# Patient Record
Sex: Female | Born: 1950 | Race: White | Hispanic: No | Marital: Married | Smoking: Never smoker
Health system: Southern US, Community
[De-identification: ages and names within clinical notes are randomized; demographics above are authoritative.]

## PROBLEM LIST (undated history)

## (undated) DIAGNOSIS — F329 Major depressive disorder, single episode, unspecified: Secondary | ICD-10-CM

## (undated) DIAGNOSIS — E079 Disorder of thyroid, unspecified: Secondary | ICD-10-CM

## (undated) DIAGNOSIS — N952 Postmenopausal atrophic vaginitis: Secondary | ICD-10-CM

## (undated) DIAGNOSIS — L80 Vitiligo: Secondary | ICD-10-CM

## (undated) DIAGNOSIS — G4730 Sleep apnea, unspecified: Secondary | ICD-10-CM

## (undated) DIAGNOSIS — I10 Essential (primary) hypertension: Secondary | ICD-10-CM

## (undated) DIAGNOSIS — F419 Anxiety disorder, unspecified: Secondary | ICD-10-CM

## (undated) DIAGNOSIS — G4700 Insomnia, unspecified: Secondary | ICD-10-CM

## (undated) DIAGNOSIS — F32A Depression, unspecified: Secondary | ICD-10-CM

## (undated) HISTORY — DX: Postmenopausal atrophic vaginitis: N95.2

## (undated) HISTORY — DX: Major depressive disorder, single episode, unspecified: F32.9

## (undated) HISTORY — DX: Sleep apnea, unspecified: G47.30

## (undated) HISTORY — DX: Depression, unspecified: F32.A

## (undated) HISTORY — DX: Insomnia, unspecified: G47.00

## (undated) HISTORY — PX: CHOLECYSTECTOMY: SHX55

## (undated) HISTORY — DX: Essential (primary) hypertension: I10

## (undated) HISTORY — DX: Vitiligo: L80

## (undated) HISTORY — DX: Disorder of thyroid, unspecified: E07.9

## (undated) HISTORY — DX: Anxiety disorder, unspecified: F41.9

---

## 1989-08-28 HISTORY — PX: CRYOTHERAPY: SHX1416

## 2007-08-26 ENCOUNTER — Ambulatory Visit: Payer: Self-pay | Admitting: Gastroenterology

## 2007-09-04 ENCOUNTER — Ambulatory Visit: Payer: Self-pay | Admitting: Gastroenterology

## 2007-09-04 ENCOUNTER — Encounter: Payer: Self-pay | Admitting: Gastroenterology

## 2007-09-04 DIAGNOSIS — K644 Residual hemorrhoidal skin tags: Secondary | ICD-10-CM | POA: Insufficient documentation

## 2007-09-04 DIAGNOSIS — K5730 Diverticulosis of large intestine without perforation or abscess without bleeding: Secondary | ICD-10-CM | POA: Insufficient documentation

## 2008-01-08 ENCOUNTER — Inpatient Hospital Stay: Payer: Self-pay | Admitting: Internal Medicine

## 2008-08-28 HISTORY — PX: COLONOSCOPY: SHX174

## 2008-10-19 ENCOUNTER — Emergency Department: Payer: Self-pay | Admitting: Emergency Medicine

## 2010-06-29 ENCOUNTER — Ambulatory Visit: Payer: Self-pay | Admitting: Internal Medicine

## 2010-10-21 ENCOUNTER — Ambulatory Visit: Payer: Self-pay | Admitting: Internal Medicine

## 2013-01-27 ENCOUNTER — Ambulatory Visit: Payer: Self-pay | Admitting: Neurology

## 2013-12-22 DIAGNOSIS — M5090 Cervical disc disorder, unspecified, unspecified cervical region: Secondary | ICD-10-CM | POA: Insufficient documentation

## 2013-12-22 DIAGNOSIS — Z9989 Dependence on other enabling machines and devices: Secondary | ICD-10-CM | POA: Insufficient documentation

## 2014-03-04 HISTORY — PX: ESOPHAGOGASTRODUODENOSCOPY ENDOSCOPY: SHX5814

## 2014-03-05 ENCOUNTER — Ambulatory Visit: Payer: Self-pay | Admitting: Gastroenterology

## 2014-03-09 LAB — PATHOLOGY REPORT

## 2014-04-21 ENCOUNTER — Encounter: Payer: Self-pay | Admitting: Gastroenterology

## 2015-06-23 ENCOUNTER — Other Ambulatory Visit: Payer: Self-pay | Admitting: Internal Medicine

## 2015-06-23 DIAGNOSIS — M5000 Cervical disc disorder with myelopathy, unspecified cervical region: Secondary | ICD-10-CM

## 2015-07-02 ENCOUNTER — Ambulatory Visit
Admission: RE | Admit: 2015-07-02 | Discharge: 2015-07-02 | Disposition: A | Discharge status: Home / Self Care | Payer: BLUE CROSS/BLUE SHIELD | Source: Ambulatory Visit | Attending: Internal Medicine | Admitting: Internal Medicine

## 2015-07-02 DIAGNOSIS — M5010 Cervical disc disorder with radiculopathy, unspecified cervical region: Secondary | ICD-10-CM | POA: Diagnosis present

## 2015-07-02 DIAGNOSIS — M2578 Osteophyte, vertebrae: Secondary | ICD-10-CM | POA: Insufficient documentation

## 2015-07-02 DIAGNOSIS — M50221 Other cervical disc displacement at C4-C5 level: Secondary | ICD-10-CM | POA: Diagnosis not present

## 2015-07-02 DIAGNOSIS — M4802 Spinal stenosis, cervical region: Secondary | ICD-10-CM | POA: Diagnosis not present

## 2015-07-02 DIAGNOSIS — M5000 Cervical disc disorder with myelopathy, unspecified cervical region: Secondary | ICD-10-CM

## 2015-07-27 DIAGNOSIS — D510 Vitamin B12 deficiency anemia due to intrinsic factor deficiency: Secondary | ICD-10-CM | POA: Insufficient documentation

## 2015-09-29 HISTORY — PX: NECK SURGERY: SHX720

## 2016-03-01 DIAGNOSIS — E039 Hypothyroidism, unspecified: Secondary | ICD-10-CM | POA: Insufficient documentation

## 2016-06-05 ENCOUNTER — Other Ambulatory Visit: Payer: Self-pay | Admitting: Obstetrics and Gynecology

## 2016-06-05 ENCOUNTER — Inpatient Hospital Stay
Admission: RE | Admit: 2016-06-05 | Discharge: 2016-06-05 | Disposition: A | Discharge status: Home / Self Care | Payer: Self-pay | Source: Ambulatory Visit | Attending: *Deleted | Admitting: *Deleted

## 2016-06-05 ENCOUNTER — Other Ambulatory Visit: Payer: Self-pay | Admitting: *Deleted

## 2016-06-05 DIAGNOSIS — Z9289 Personal history of other medical treatment: Secondary | ICD-10-CM

## 2016-06-05 DIAGNOSIS — N63 Unspecified lump in unspecified breast: Secondary | ICD-10-CM

## 2016-06-07 ENCOUNTER — Ambulatory Visit
Admission: RE | Admit: 2016-06-07 | Discharge: 2016-06-07 | Disposition: A | Discharge status: Home / Self Care | Payer: BLUE CROSS/BLUE SHIELD | Source: Ambulatory Visit | Attending: Obstetrics and Gynecology | Admitting: Obstetrics and Gynecology

## 2016-06-07 DIAGNOSIS — N63 Unspecified lump in unspecified breast: Secondary | ICD-10-CM

## 2016-06-16 ENCOUNTER — Other Ambulatory Visit: Payer: BLUE CROSS/BLUE SHIELD

## 2016-06-16 ENCOUNTER — Ambulatory Visit: Payer: BLUE CROSS/BLUE SHIELD

## 2017-07-05 ENCOUNTER — Other Ambulatory Visit: Payer: Self-pay | Admitting: Internal Medicine

## 2017-07-05 DIAGNOSIS — Z1231 Encounter for screening mammogram for malignant neoplasm of breast: Secondary | ICD-10-CM

## 2017-07-23 ENCOUNTER — Ambulatory Visit (INDEPENDENT_AMBULATORY_CARE_PROVIDER_SITE_OTHER): Payer: BLUE CROSS/BLUE SHIELD | Admitting: Obstetrics and Gynecology

## 2017-07-23 ENCOUNTER — Encounter: Payer: Self-pay | Admitting: Obstetrics and Gynecology

## 2017-07-23 VITALS — BP 112/72 | HR 60 | Ht 66.0 in | Wt 163.0 lb

## 2017-07-23 DIAGNOSIS — Z780 Asymptomatic menopausal state: Secondary | ICD-10-CM

## 2017-07-23 DIAGNOSIS — Z13820 Encounter for screening for osteoporosis: Secondary | ICD-10-CM | POA: Diagnosis not present

## 2017-07-23 DIAGNOSIS — Z23 Encounter for immunization: Secondary | ICD-10-CM

## 2017-07-23 DIAGNOSIS — N941 Unspecified dyspareunia: Secondary | ICD-10-CM

## 2017-07-23 DIAGNOSIS — Z9189 Other specified personal risk factors, not elsewhere classified: Secondary | ICD-10-CM | POA: Diagnosis not present

## 2017-07-23 DIAGNOSIS — L80 Vitiligo: Secondary | ICD-10-CM

## 2017-07-23 DIAGNOSIS — Z0000 Encounter for general adult medical examination without abnormal findings: Secondary | ICD-10-CM | POA: Diagnosis not present

## 2017-07-23 MED ORDER — PRASTERONE 6.5 MG VA INST: 6.5000 mg | VAGINAL_INSERT | Freq: Every day | VAGINAL | 11 refills | Status: AC

## 2017-07-23 NOTE — Progress Notes (Signed)
Gynecology Annual Exam  PCP: Rusty Aus, MD  Chief Complaint:  Chief Complaint  Patient presents with  . Gynecologic Exam    History of Present Illness: Patient is a 66 y.o. G0P0000 presents for annual exam. The patient complains of dyspareunia and vaginal dryness.   LMP: No LMP recorded. Patient is postmenopausal. Denies postmenopausal bleeding or spotting.  The patient is sexually active. She currently uses none for contraception. She admits to dyspareunia.  The patient does perform self breast exams.  There is no notable family history of breast or ovarian cancer in her family.  The patient wears seatbelts: yes.   The patient has regular exercise: not asked.    The patient denies current symptoms of depression.    Review of Systems: Review of Systems  Constitutional: Negative for chills, fever, malaise/fatigue and weight loss.  HENT: Negative for congestion, hearing loss and sinus pain.   Eyes: Negative for blurred vision and double vision.  Respiratory: Negative for cough, sputum production, shortness of breath and wheezing.   Cardiovascular: Negative for chest pain, palpitations, orthopnea and leg swelling.  Gastrointestinal: Negative for abdominal pain, constipation, diarrhea, nausea and vomiting.  Genitourinary: Negative for dysuria, flank pain, frequency, hematuria and urgency.  Musculoskeletal: Negative for back pain, falls and joint pain.  Skin: Negative for itching and rash.  Neurological: Negative for dizziness and headaches.  Psychiatric/Behavioral: Negative for depression, substance abuse and suicidal ideas. The patient is not nervous/anxious.     Past Medical History:  Past Medical History:  Diagnosis Date  . Anxiety   . Atrophic vaginitis   . Depression   . Hypertension   . Insomnia   . Sleep apnea   . Thyroid disease   . Vitiligo     Past Surgical History:  Past Surgical History:  Procedure Laterality Date  . CHOLECYSTECTOMY    .  COLONOSCOPY  2010   normal  . CRYOTHERAPY  1991  . ESOPHAGOGASTRODUODENOSCOPY ENDOSCOPY  03/04/2014  . NECK SURGERY  09/2015   degenerative disc     Gynecologic History:  No LMP recorded. Patient is postmenopausal. Contraception: none Last Pap: Results were: NIL and HR HPV negative . Reviewed in [facility], paps as far back as 2011 were all negative.  Last mammogram:06/07/2016 Results were: BI-RAD II Obstetric History: G0P0000  Family History:  Family History  Problem Relation Age of Onset  . Breast cancer Sister 60  . Stomach cancer Mother 34    Social History:  Social History   Socioeconomic History  . Marital status: Married    Spouse name: Not on file  . Number of children: Not on file  . Years of education: Not on file  . Highest education level: Not on file  Social Needs  . Financial resource strain: Not on file  . Food insecurity - worry: Not on file  . Food insecurity - inability: Not on file  . Transportation needs - medical: Not on file  . Transportation needs - non-medical: Not on file  Occupational History  . Not on file  Tobacco Use  . Smoking status: Never Smoker  . Smokeless tobacco: Never Used  Substance and Sexual Activity  . Alcohol use: No    Frequency: Never  . Drug use: No  . Sexual activity: Yes    Birth control/protection: Post-menopausal  Other Topics Concern  . Not on file  Social History Narrative  . Not on file    Allergies:  Allergies  Allergen Reactions  .  Venlafaxine Shortness Of Breath    Medications: Prior to Admission medications   Medication Sig Start Date End Date Taking? Authorizing Provider  ALPRAZolam Duanne Moron) 0.5 MG tablet Take by mouth. 11/18/14  Yes [provider]  citalopram (CELEXA) 20 MG tablet Take by mouth. 03/29/16  Yes [provider]  Cyanocobalamin (B-12) 1000 MCG LOZG Place under the tongue.   Yes [provider]  hydrochlorothiazide (HYDRODIURIL) 25 MG tablet Take 25 mg by mouth  daily. for blood pressure 05/29/17  Yes [provider]  mupirocin cream (BACTROBAN) 2 % Apply topically 2 (two) times daily as needed. 05/25/17  Yes [provider]  SYNTHROID 150 MCG tablet TAKE 1 TAB BY MOUTH ONCE DAILY ON AN EMPTY STOMACH WITH GLASS OF WATER 30-60 MINS BEFORE BREAKFAST 05/25/17  Yes [provider]  zolpidem (AMBIEN) 10 MG tablet TAKE 1 TABLET BY MOUTH EVERY DAY AT NIGHT 07/15/17  Yes [provider]  Prasterone (INTRAROSA) 6.5 MG INST Place 6.5 mg vaginally at bedtime. 07/23/17 08/22/17  Homero Fellers, MD    Physical Exam Vitals: Blood pressure 112/72, pulse 60, height 5\' 6"  (1.676 m), weight 163 lb (73.9 kg).  Physical Exam  Constitutional: She is oriented to person, place, and time. She appears well-developed.  Genitourinary: Vagina normal and uterus normal. Pelvic exam was performed with patient prone. There is no lesion on the right labia. There is no lesion on the left labia.    Vagina exhibits no lesion. Right adnexum does not display mass. Left adnexum does not display mass. Cervix does not exhibit motion tenderness, lesion, discharge or polyp.    Genitourinary Comments: Rectal deferred at patient request.  HENT:  Head: Normocephalic and atraumatic.  Eyes: EOM are normal.  Neck: Neck supple. No thyromegaly present.  Cardiovascular: Normal rate, regular rhythm and normal heart sounds.  Pulmonary/Chest: Effort normal and breath sounds normal. Right breast exhibits no inverted nipple, no mass, no nipple discharge and no skin change. Left breast exhibits no inverted nipple, no mass, no nipple discharge and no skin change.  Abdominal: Soft. Bowel sounds are normal. She exhibits no distension and no mass.  Neurological: She is alert and oriented to person, place, and time.  Skin: Skin is warm and dry.  Psychiatric: She has a normal mood and affect. Her behavior is normal. Judgment and thought content normal.  Vitals  reviewed.    Female chaperone present for pelvic and breast  portions of the physical exam  Assessment: 66 y.o. G0P0000 routine annual exam  Plan: Problem List Items Addressed This Visit    None    Visit Diagnoses    Need for prophylactic vaccination and inoculation against influenza    -  Primary   Relevant Orders   Flu Vaccine QUAD 36+ mos IM (Fluarix, Quad PF) (Completed)   Health care maintenance       Relevant Orders   DG Bone Density   PapIG, HPV, rfx 16/18   At risk for decreased bone density       Relevant Orders   DG Bone Density   Dyspareunia in female       Relevant Medications   Prasterone (INTRAROSA) 6.5 MG INST      1) Mammogram - recommend yearly screening mammogram.  Mammogram is planned for 07/25/2017  2) STI screening was offered and declined  3) ASCCP guidelines and rational discussed.  Patient opts for continued screening interval  4) Dexa scan ordered.  5) Colonoscopy -- Screening recommended starting  at age 9 for average risk individuals, age 3 for individuals deemed at increased risk (including African Americans) and recommended to continue until age 50.  For patient age 5-85 individualized approach is recommended.  Gold standard screening is via colonoscopy, Cologuard screening is an acceptable alternative for patient unwilling or unable to undergo colonoscopy.  "Colorectal cancer screening for average?risk adults: 2018 guideline update from the American Cancer Society"[place]: A Cancer Journal for Clinicians: Jan 24, 2017  Patient reports that her last colonoscopy was 8 years ago and that she will need on in 2 years.    6) Routine healthcare maintenance including cholesterol, diabetes screening discussed managed by PCP   7) Dyspareunia- patient was given sample of intrarosa. Discussed MonaLisa Touch procedure.   Follow up in 1 year.  Patient was okay with phone calls and leaving messages about test results.

## 2017-07-25 ENCOUNTER — Ambulatory Visit
Admission: RE | Admit: 2017-07-25 | Discharge: 2017-07-25 | Disposition: A | Discharge status: Home / Self Care | Payer: BLUE CROSS/BLUE SHIELD | Source: Ambulatory Visit | Attending: Internal Medicine | Admitting: Internal Medicine

## 2017-07-25 DIAGNOSIS — Z1231 Encounter for screening mammogram for malignant neoplasm of breast: Secondary | ICD-10-CM | POA: Diagnosis not present

## 2017-07-26 LAB — PAPIG, HPV, RFX 16/18: PAP SMEAR COMMENT: 0

## 2017-07-26 LAB — HPV, LOW VOLUME (REFLEX): HPV, LOW VOL REFLEX: NEGATIVE

## 2017-07-31 NOTE — Addendum Note (Signed)
Addended by: Adrian Prows on: 07/31/2017 02:17 PM   Modules accepted: Orders

## 2017-07-31 NOTE — Progress Notes (Signed)
Discussed Result with patient. Repeat in 5 years. If hx of 10 years of normals can discontinue screening because of age >66yo.

## 2017-09-12 ENCOUNTER — Ambulatory Visit
Admission: RE | Admit: 2017-09-12 | Discharge: 2017-09-12 | Disposition: A | Discharge status: Home / Self Care | Payer: BLUE CROSS/BLUE SHIELD | Source: Ambulatory Visit | Attending: Obstetrics and Gynecology | Admitting: Obstetrics and Gynecology

## 2017-09-12 DIAGNOSIS — Z8262 Family history of osteoporosis: Secondary | ICD-10-CM | POA: Insufficient documentation

## 2017-09-12 DIAGNOSIS — M85852 Other specified disorders of bone density and structure, left thigh: Secondary | ICD-10-CM | POA: Insufficient documentation

## 2017-09-12 DIAGNOSIS — Z780 Asymptomatic menopausal state: Secondary | ICD-10-CM | POA: Diagnosis present

## 2017-09-26 ENCOUNTER — Telehealth: Payer: Self-pay

## 2017-09-26 NOTE — Telephone Encounter (Signed)
Please advise for results. Thank you!

## 2017-09-26 NOTE — Telephone Encounter (Signed)
Pt had Dexa Scan on 09/12/17 ordered by CS. She hasn't received results. She is unable to get into my chart. Cb#[phone]

## 2017-10-01 NOTE — Telephone Encounter (Signed)
Also tried to call, no answer

## 2017-10-01 NOTE — Telephone Encounter (Signed)
Osteopenia:  vitamin D 600 Iu/day , calciuma 1200 mg/day, weight bearing exercise, balance training. Minimize alcohol consumption.  Called, no answer left message to call the office tomorrow to discuss. Thank you

## 2017-10-01 NOTE — Progress Notes (Signed)
Called ask patient to call the office tomorrow so that we could discuss result. Osteopenia, recommend repeat screening in 5 years, vitamin D 6000 IU/day and calcium supplementation 1200 mg/day, healthy lifestyle, weight bearing resistance exercise, and balance training.

## 2017-10-02 ENCOUNTER — Encounter: Payer: Self-pay | Admitting: Gastroenterology

## 2017-10-03 NOTE — Telephone Encounter (Signed)
Pt is calling about her bone density results. Pt report had missed call last week. Please advise

## 2017-10-04 NOTE — Telephone Encounter (Signed)
Pt states she has missed a call regarding her bone density results. She would like to speak to someone, she has a Advertising account executive and a message may be left if need be. CB# [phone]  PT aware of CS message. She will call back if she has any questions

## 2018-01-16 ENCOUNTER — Encounter: Payer: Self-pay | Admitting: Obstetrics and Gynecology

## 2018-01-16 ENCOUNTER — Ambulatory Visit (INDEPENDENT_AMBULATORY_CARE_PROVIDER_SITE_OTHER): Payer: BLUE CROSS/BLUE SHIELD | Admitting: Obstetrics and Gynecology

## 2018-01-16 VITALS — BP 130/80 | HR 63 | Ht 66.0 in | Wt 164.5 lb

## 2018-01-16 DIAGNOSIS — N3001 Acute cystitis with hematuria: Secondary | ICD-10-CM | POA: Diagnosis not present

## 2018-01-16 LAB — POCT URINALYSIS DIPSTICK
BILIRUBIN UA: NEGATIVE
GLUCOSE UA: NEGATIVE
Ketones, UA: NEGATIVE
Nitrite, UA: NEGATIVE
Protein, UA: POSITIVE — AB
Spec Grav, UA: 1.01 (ref 1.010–1.025)
pH, UA: 8 (ref 5.0–8.0)

## 2018-01-16 MED ORDER — NITROFURANTOIN MONOHYD MACRO 100 MG PO CAPS
100.0000 mg | ORAL_CAPSULE | Freq: Two times a day (BID) | ORAL | 0 refills | Status: DC
Start: 2018-01-16 — End: 2019-12-29

## 2018-01-16 NOTE — Progress Notes (Signed)
Rusty Aus, MD   Chief Complaint  Patient presents with  . Urinary Tract Infection    Uncomfortable, frequency x1 day     HPI:      [name] is a 67 y.o. G0P0000 who LMP was No LMP recorded. Patient is postmenopausal., presents today for UTI sx of urinary frequency, urgency, dysuria, and pressure since yesterday. No LBP, fevers, hematuria. No vag sx, no VB. Hx of UTIs yrs ago but not recently.    Past Medical History:  Diagnosis Date  . Anxiety   . Atrophic vaginitis   . Depression   . Hypertension   . Insomnia   . Sleep apnea   . Thyroid disease   . Vitiligo     Past Surgical History:  Procedure Laterality Date  . CHOLECYSTECTOMY    . COLONOSCOPY  2010   normal  . CRYOTHERAPY  1991  . ESOPHAGOGASTRODUODENOSCOPY ENDOSCOPY  03/04/2014  . NECK SURGERY  09/2015   degenerative disc     Family History  Problem Relation Age of Onset  . Breast cancer Sister 7  . Stomach cancer Mother 45    Social History   Socioeconomic History  . Marital status: Married    Spouse name: Not on file  . Number of children: Not on file  . Years of education: Not on file  . Highest education level: Not on file  Occupational History  . Not on file  Social Needs  . Financial resource strain: Not on file  . Food insecurity:    Worry: Not on file    Inability: Not on file  . Transportation needs:    Medical: Not on file    Non-medical: Not on file  Tobacco Use  . Smoking status: Never Smoker  . Smokeless tobacco: Never Used  Substance and Sexual Activity  . Alcohol use: No    Frequency: Never  . Drug use: No  . Sexual activity: Yes    Birth control/protection: Post-menopausal  Lifestyle  . Physical activity:    Days per week: 0 days    Minutes per session: 0 min  . Stress: Very much  Relationships  . Social connections:    Talks on phone: Three times a week    Gets together: Once a week    Attends religious service: Never    Active member of  club or organization: No    Attends meetings of clubs or organizations: Never    Relationship status: Married  . Intimate partner violence:    Fear of current or ex partner: No    Emotionally abused: No    Physically abused: No    Forced sexual activity: No  Other Topics Concern  . Not on file  Social History Narrative  . Not on file    Outpatient Medications Prior to Visit  Medication Sig Dispense Refill  . hydrochlorothiazide (HYDRODIURIL) 25 MG tablet Take 25 mg by mouth daily. for blood pressure  3  . SYNTHROID 150 MCG tablet TAKE 1 TAB BY MOUTH ONCE DAILY ON AN EMPTY STOMACH WITH GLASS OF WATER 30-60 MINS BEFORE BREAKFAST  3  . zolpidem (AMBIEN) 10 MG tablet TAKE 1 TABLET BY MOUTH EVERY DAY AT NIGHT  5  . ALPRAZolam (XANAX) 0.5 MG tablet Take by mouth.    . citalopram (CELEXA) 20 MG tablet Take by mouth.    . Cyanocobalamin (B-12) 1000 MCG LOZG Place under the tongue.    . mupirocin cream (BACTROBAN) 2 %  Apply topically 2 (two) times daily as needed.  12   No facility-administered medications prior to visit.     ROS:  Review of Systems  Constitutional: Negative for fever.  Gastrointestinal: Negative for blood in stool, constipation, diarrhea, nausea and vomiting.  Genitourinary: Positive for dysuria, frequency and urgency. Negative for dyspareunia, flank pain, hematuria, vaginal bleeding, vaginal discharge and vaginal pain.  Musculoskeletal: Negative for back pain.  Skin: Negative for rash.   BREAST: No symptoms   OBJECTIVE:   Vitals:  BP 130/80   Pulse 63   Ht 5\' 6"  (1.676 m)   Wt 164 lb 8 oz (74.6 kg)   BMI 26.55 kg/m   Physical Exam  Constitutional: She is oriented to person, place, and time. She appears well-developed.  Pulmonary/Chest: Effort normal.  Abdominal: There is no CVA tenderness.  Musculoskeletal: Normal range of motion.  Neurological: She is alert and oriented to person, place, and time. No cranial nerve deficit.  Psychiatric: She has a  normal mood and affect. Her behavior is normal. Judgment and thought content normal.  Vitals reviewed.   Results: Results for orders placed or performed in visit on 01/16/18 (from the past 24 hour(s))  POCT Urinalysis Dipstick     Status: Abnormal   Collection Time: 01/16/18 10:13 AM  Result Value Ref Range   Color, UA yellow    Clarity, UA cloudy    Glucose, UA Negative Negative   Bilirubin, UA neg    Ketones, UA neg    Spec Grav, UA 1.010 1.010 - 1.025   Blood, UA mod    pH, UA 8.0 5.0 - 8.0   Protein, UA Positive (A) Negative   Urobilinogen, UA  0.2 or 1.0 E.U./dL   Nitrite, UA neg    Leukocytes, UA Moderate (2+) (A) Negative   Appearance     Odor       Assessment/Plan: Acute cystitis with hematuria - Pos dip. Check C&S. Rx macrobid. F/u prn.  - Plan: POCT Urinalysis Dipstick, Urine Culture, nitrofurantoin, macrocrystal-monohydrate, (MACROBID) 100 MG capsule    Meds ordered this encounter  Medications  . nitrofurantoin, macrocrystal-monohydrate, (MACROBID) 100 MG capsule    Sig: Take 1 capsule (100 mg total) by mouth 2 (two) times daily.    Dispense:  10 capsule    Refill:  0    Order Specific Question:   Supervising Provider    Answer:   Gae Dry [[id number]]      Return if symptoms worsen or fail to improve.  Alicia B. Copland, PA-C 01/16/2018 10:15 AM

## 2018-01-16 NOTE — Patient Instructions (Signed)
I value your feedback and entrusting us with your care. If you get a [facility] patient survey, I would appreciate you taking the time to let us know about your experience today. Thank you! 

## 2018-01-18 LAB — URINE CULTURE

## 2018-05-06 DIAGNOSIS — M8589 Other specified disorders of bone density and structure, multiple sites: Secondary | ICD-10-CM | POA: Insufficient documentation

## 2018-08-23 ENCOUNTER — Other Ambulatory Visit: Payer: Self-pay | Admitting: Internal Medicine

## 2018-08-23 DIAGNOSIS — Z1231 Encounter for screening mammogram for malignant neoplasm of breast: Secondary | ICD-10-CM

## 2018-09-25 ENCOUNTER — Ambulatory Visit: Payer: BLUE CROSS/BLUE SHIELD | Admitting: Obstetrics and Gynecology

## 2018-10-01 ENCOUNTER — Ambulatory Visit
Admission: RE | Admit: 2018-10-01 | Discharge: 2018-10-01 | Disposition: A | Discharge status: Home / Self Care | Payer: BLUE CROSS/BLUE SHIELD | Source: Ambulatory Visit | Attending: Internal Medicine | Admitting: Internal Medicine

## 2018-10-01 DIAGNOSIS — Z1231 Encounter for screening mammogram for malignant neoplasm of breast: Secondary | ICD-10-CM | POA: Insufficient documentation

## 2018-10-03 ENCOUNTER — Other Ambulatory Visit (HOSPITAL_COMMUNITY)
Admission: RE | Admit: 2018-10-03 | Discharge: 2018-10-03 | Disposition: A | Discharge status: Home / Self Care | Payer: BLUE CROSS/BLUE SHIELD | Source: Ambulatory Visit | Attending: Obstetrics and Gynecology | Admitting: Obstetrics and Gynecology

## 2018-10-03 ENCOUNTER — Ambulatory Visit (INDEPENDENT_AMBULATORY_CARE_PROVIDER_SITE_OTHER): Payer: BLUE CROSS/BLUE SHIELD | Admitting: Obstetrics and Gynecology

## 2018-10-03 ENCOUNTER — Encounter: Payer: Self-pay | Admitting: Obstetrics and Gynecology

## 2018-10-03 VITALS — BP 110/62 | HR 72 | Ht 66.0 in | Wt 157.0 lb

## 2018-10-03 DIAGNOSIS — Z01419 Encounter for gynecological examination (general) (routine) without abnormal findings: Secondary | ICD-10-CM | POA: Diagnosis not present

## 2018-10-03 DIAGNOSIS — D280 Benign neoplasm of vulva: Secondary | ICD-10-CM | POA: Diagnosis not present

## 2018-10-03 DIAGNOSIS — Z0000 Encounter for general adult medical examination without abnormal findings: Secondary | ICD-10-CM

## 2018-10-03 DIAGNOSIS — L80 Vitiligo: Secondary | ICD-10-CM

## 2018-10-03 DIAGNOSIS — R1900 Intra-abdominal and pelvic swelling, mass and lump, unspecified site: Secondary | ICD-10-CM

## 2018-10-03 DIAGNOSIS — N9089 Other specified noninflammatory disorders of vulva and perineum: Secondary | ICD-10-CM

## 2018-10-03 NOTE — Progress Notes (Signed)
Gynecology Annual Exam  PCP: Rusty Aus, MD  Chief Complaint:  Chief Complaint  Patient presents with  . Gynecologic Exam    History of Present Illness:Patient is a 68 y.o. G0P0000 presents for annual exam. The patient has no complaints today.   LMP: No LMP recorded. Patient is postmenopausal.  The patient is sexually active. She denies dyspareunia.  The patient does perform self breast exams.  There is no notable family history of breast or ovarian cancer in her family.  The patient wears seatbelts: yes.   The patient has regular exercise: not asked.    The patient denies current symptoms of depression.     Review of Systems: ROS  Past Medical History:  Past Medical History:  Diagnosis Date  . Anxiety   . Atrophic vaginitis   . Depression   . Hypertension   . Insomnia   . Sleep apnea   . Thyroid disease   . Vitiligo     Past Surgical History:  Past Surgical History:  Procedure Laterality Date  . CHOLECYSTECTOMY    . COLONOSCOPY  2010   normal  . CRYOTHERAPY  1991  . ESOPHAGOGASTRODUODENOSCOPY ENDOSCOPY  03/04/2014  . NECK SURGERY  09/2015   degenerative disc     Gynecologic History:  No LMP recorded. Patient is postmenopausal. Last Pap: Results were: 2019- NIL HPV-   Last mammogram: 2020 Results were: BI-RAD I  Obstetric History: G0P0000  Family History:  Family History  Problem Relation Age of Onset  . Breast cancer Sister 79  . Stomach cancer Mother 78    Social History:  Social History   Socioeconomic History  . Marital status: Married    Spouse name: Not on file  . Number of children: Not on file  . Years of education: Not on file  . Highest education level: Not on file  Occupational History  . Not on file  Social Needs  . Financial resource strain: Not on file  . Food insecurity:    Worry: Not on file    Inability: Not on file  . Transportation needs:    Medical: Not on file    Non-medical: Not on file  Tobacco Use  .  Smoking status: Never Smoker  . Smokeless tobacco: Never Used  Substance and Sexual Activity  . Alcohol use: No    Frequency: Never  . Drug use: No  . Sexual activity: Yes    Birth control/protection: Post-menopausal  Lifestyle  . Physical activity:    Days per week: 0 days    Minutes per session: 0 min  . Stress: Very much  Relationships  . Social connections:    Talks on phone: Three times a week    Gets together: Once a week    Attends religious service: Never    Active member of club or organization: No    Attends meetings of clubs or organizations: Never    Relationship status: Married  . Intimate partner violence:    Fear of current or ex partner: No    Emotionally abused: No    Physically abused: No    Forced sexual activity: No  Other Topics Concern  . Not on file  Social History Narrative  . Not on file    Allergies:  Allergies  Allergen Reactions  . Venlafaxine Shortness Of Breath    Medications: Prior to Admission medications   Medication Sig Start Date End Date Taking? Authorizing Provider  ALPRAZolam Duanne Moron) 0.5 MG tablet  Take by mouth. 11/18/14  Yes [provider]  citalopram (CELEXA) 20 MG tablet Take by mouth. 03/29/16  Yes [provider]  Cyanocobalamin (B-12) 1000 MCG LOZG Place under the tongue.   Yes [provider]  gabapentin (NEURONTIN) 100 MG capsule Take by mouth. 05/06/18 05/06/19 Yes [provider]  hydrochlorothiazide (HYDRODIURIL) 25 MG tablet Take 25 mg by mouth daily. for blood pressure 05/29/17  Yes [provider]  nitrofurantoin, macrocrystal-monohydrate, (MACROBID) 100 MG capsule Take 1 capsule (100 mg total) by mouth 2 (two) times daily. 4/40/34  Yes Copland, Elmo Putt B, PA-C  pantoprazole (PROTONIX) 40 MG tablet TAKE 1 TABLET BY MOUTH EVERY DAY 09/20/18  Yes [provider]  SYNTHROID 150 MCG tablet TAKE 1 TAB BY MOUTH ONCE DAILY ON AN EMPTY STOMACH WITH GLASS OF WATER 30-60 MINS BEFORE  BREAKFAST 05/25/17  Yes [provider]  zolpidem (AMBIEN) 10 MG tablet TAKE 1 TABLET BY MOUTH EVERY DAY AT NIGHT 07/15/17  Yes [provider]    Physical Exam Vitals: Blood pressure 110/62, pulse 72, height 5\' 6"  (1.676 m), weight 157 lb (71.2 kg).  General: NAD HEENT: normocephalic, anicteric Thyroid: no enlargement, no palpable nodules Pulmonary: No increased work of breathing, CTAB Cardiovascular: RRR, distal pulses 2+ Breast: Breast symmetrical, no tenderness, no palpable nodules or masses, no skin or nipple retraction present, no nipple discharge.  No axillary or supraclavicular lymphadenopathy. Abdomen: NABS, soft, non-tender, non-distended.  Umbilicus without lesions.  No hepatomegaly, splenomegaly or masses palpable. No evidence of hernia  Genitourinary:  External: Normal external female genitalia.  Normal urethral meatus, normal Bartholin's and Skene's glands.    Vagina: Normal vaginal mucosa, no evidence of prolapse.    Cervix: Grossly normal in appearance, no bleeding  Uterus: Non-enlarged, mobile, normal contour.  No CMT  Adnexa: ovaries non-enlarged, no adnexal masses  Rectal: deferred  Lymphatic: no evidence of inguinal lymphadenopathy Extremities: no edema, erythema, or tenderness Neurologic: Grossly intact Psychiatric: mood appropriate, affect full  Female chaperone present for pelvic and breast  portions of the physical exam   VULVAR BIOPSY NOTE The indications for vulvar biopsy (rule out neoplasia, establish lichen sclerosus diagnosis) were reviewed.   Risks of the biopsy including pain, bleeding, infection, inadequate specimen, scarring and need for additional procedures  were discussed. The patient stated understanding and agreed to undergo procedure today. Consent was signed,  time out performed.   The patient's vulva was prepped with Betadine. 1% lidocaine was injected into area of concern. A 3 -mm punch biopsy was done, biopsy tissue was  picked up with sterile forceps and sterile scissors were used to excise the lesion.  Small bleeding was noted and hemostasis was achieved using silver nitrate sticks.  The patient tolerated the procedure well. Post-procedure instructions  (pelvic rest for one week) were given to the patient. The patient is to call with heavy bleeding, fever greater than 100.4, foul smelling vaginal discharge or other concerns.      Assessment: 68 y.o. G0P0000 routine annual exam  Plan: Problem List Items Addressed This Visit    None    Visit Diagnoses    Health care maintenance    -  Primary   Pelvic mass       Relevant Orders   US PELVIS TRANSVANGINAL NON-OB (TV ONLY)   Vulvar lesion       Primary vitiligo          1) Mammogram - recommend yearly screening mammogram.  Mammogram Is up to  date  2) STI screening  was notoffered and therefore not obtained  3) ASCCP guidelines and rational discussed.  Patient opts for every 3 years screening interval  4) Osteopenia- patient is taking calcium and vitamin D supplementation, repeat DEXA scan in 5 years.   5) Routine healthcare maintenance including cholesterol, diabetes screening discussed managed by PCP  6) Colonoscopy is planned for the end of this month. Patient has scheduled this procedure with GI   7) Vulvar biopsy performed today  8) Left ovary feels enlarged, will obtain pelvic US.  9)  Return in about 1 week (around 10/10/2018) for Return GYN and [place].  Colonoscopy planned for the end of the month Vulvar biopsy performed today Will follow up for pelvic US in 1 week Discussed osteopenia- vitamin D and calcium supplementation  Adrian Prows MD [place], [place] 10/03/2018 10:02 AM

## 2018-10-03 NOTE — Patient Instructions (Signed)
[facility] [address] [address] [phone]  "Scream Cream"

## 2018-10-07 NOTE — Progress Notes (Signed)
Called and discussed benign result with patient

## 2018-10-17 ENCOUNTER — Ambulatory Visit: Payer: Medicare Other | Admitting: Obstetrics and Gynecology

## 2018-10-17 ENCOUNTER — Ambulatory Visit (INDEPENDENT_AMBULATORY_CARE_PROVIDER_SITE_OTHER): Payer: BLUE CROSS/BLUE SHIELD | Admitting: Obstetrics and Gynecology

## 2018-10-17 ENCOUNTER — Encounter: Payer: Self-pay | Admitting: Obstetrics and Gynecology

## 2018-10-17 ENCOUNTER — Ambulatory Visit (INDEPENDENT_AMBULATORY_CARE_PROVIDER_SITE_OTHER): Payer: BLUE CROSS/BLUE SHIELD

## 2018-10-17 VITALS — BP 134/80 | HR 71 | Ht 66.0 in | Wt 156.0 lb

## 2018-10-17 DIAGNOSIS — R1900 Intra-abdominal and pelvic swelling, mass and lump, unspecified site: Secondary | ICD-10-CM

## 2018-10-17 DIAGNOSIS — N9089 Other specified noninflammatory disorders of vulva and perineum: Secondary | ICD-10-CM | POA: Diagnosis not present

## 2018-10-17 NOTE — Progress Notes (Signed)
Patient ID: Jill Hart, female   DOB: [birth year], 68 y.o.   MRN: [id number]  Reason for Consult: Follow-up (U/S follow up )   Referred by Rusty Aus, MD  Subjective:     HPI:  Jill Hart is a 68 y.o. female She is following up today for an [place] after pelvic exam suggested pelvic mass. She is feeling well and reports no complaints. She has a colonoscopy and upper endoscopy planned for next week with GI. She underwent a vulvar biopsy at her most recent visit which resulted as [place].  Past Medical History:  Diagnosis Date  . Anxiety   . Atrophic vaginitis   . Depression   . Hypertension   . Insomnia   . Sleep apnea   . Thyroid disease   . Vitiligo    Family History  Problem Relation Age of Onset  . Breast cancer Sister 43  . Stomach cancer Mother 75   Past Surgical History:  Procedure Laterality Date  . CHOLECYSTECTOMY    . COLONOSCOPY  2010   normal  . CRYOTHERAPY  1991  . ESOPHAGOGASTRODUODENOSCOPY ENDOSCOPY  03/04/2014  . NECK SURGERY  09/2015   degenerative disc     Short Social History:  Social History   Tobacco Use  . Smoking status: Never Smoker  . Smokeless tobacco: Never Used  Substance Use Topics  . Alcohol use: No    Frequency: Never    Allergies  Allergen Reactions  . Venlafaxine Shortness Of Breath    Current Outpatient Medications  Medication Sig Dispense Refill  . ALPRAZolam (XANAX) 0.5 MG tablet Take by mouth.    . citalopram (CELEXA) 20 MG tablet Take by mouth.    . Cyanocobalamin (B-12) 1000 MCG LOZG Place under the tongue.    . famotidine (PEPCID) 20 MG tablet TAKE 1 TABLET (20 MG TOTAL) BY MOUTH 2 (TWO) TIMES DAILY AS NEEDED FOR HEARTBURN    . gabapentin (NEURONTIN) 100 MG capsule Take by mouth.    . hydrochlorothiazide (HYDRODIURIL) 25 MG tablet Take 25 mg by mouth daily. for blood pressure  3  . nitrofurantoin, macrocrystal-monohydrate, (MACROBID) 100 MG capsule Take 1 capsule (100 mg  total) by mouth 2 (two) times daily. 10 capsule 0  . pantoprazole (PROTONIX) 40 MG tablet TAKE 1 TABLET BY MOUTH EVERY DAY    . SYNTHROID 150 MCG tablet TAKE 1 TAB BY MOUTH ONCE DAILY ON AN EMPTY STOMACH WITH GLASS OF WATER 30-60 MINS BEFORE BREAKFAST  3  . zolpidem (AMBIEN) 10 MG tablet TAKE 1 TABLET BY MOUTH EVERY DAY AT NIGHT  5   No current facility-administered medications for this visit.     Review of Systems  Constitutional: Negative for chills, fatigue, fever and unexpected weight change.  HENT: Negative for trouble swallowing.  Eyes: Negative for loss of vision.  Respiratory: Negative for cough, shortness of breath and wheezing.  Cardiovascular: Negative for chest pain, leg swelling, palpitations and syncope.  GI: Negative for abdominal pain, blood in stool, diarrhea, nausea and vomiting.  GU: Negative for difficulty urinating, dysuria, frequency and hematuria.  Musculoskeletal: Negative for back pain, leg pain and joint pain.  Skin: Negative for rash.  Neurological: Negative for dizziness, headaches, light-headedness, numbness and seizures.  Psychiatric: Negative for behavioral problem, confusion, depressed mood and sleep disturbance.        Objective:  Objective   Vitals:   10/17/18 1050  BP: 134/80  Pulse: 71  Weight: 156 lb (70.8 kg)  Height: 5\' 6"  (1.676 m)   Body mass index is 25.18 kg/m.  Physical Exam Vitals signs and nursing note reviewed.  Constitutional:      Appearance: She is well-developed.  HENT:     Head: Normocephalic and atraumatic.  Eyes:     Pupils: Pupils are equal, round, and reactive to light.  Cardiovascular:     Rate and Rhythm: Normal rate and regular rhythm.  Pulmonary:     Effort: Pulmonary effort is normal. No respiratory distress.  Abdominal:     General: Abdomen is flat. There is no distension.     Palpations: Abdomen is soft. There is no mass.     Tenderness: There is no abdominal tenderness. There is no guarding or rebound.       Hernia: No hernia is present.  Skin:    General: Skin is warm and dry.  Neurological:     Mental Status: She is alert and oriented to person, place, and time.  Psychiatric:        Behavior: Behavior normal.        Thought Content: Thought content normal.        Judgment: Judgment normal.          Assessment/Plan:     68 yo  Uterus and ovaries within normal limits on today's [place]. Patient undergoing screening for colon soon. Reassurance and no additional evaluation at this time. 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 Follow up for annual in 1 year.   More than 15 minutes were spent face to face with the patient in the room with more than 50% of the time spent providing counseling and discussing the plan of management.    Adrian Prows MD [facility] 10/17/2018 12:32 PM

## 2018-10-18 ENCOUNTER — Ambulatory Visit: Payer: Medicare Other | Admitting: Obstetrics and Gynecology

## 2018-10-18 ENCOUNTER — Other Ambulatory Visit: Payer: BLUE CROSS/BLUE SHIELD

## 2019-02-12 ENCOUNTER — Other Ambulatory Visit: Payer: Self-pay | Admitting: Obstetrics and Gynecology

## 2019-02-12 ENCOUNTER — Telehealth: Payer: Self-pay

## 2019-02-12 MED ORDER — FLUCONAZOLE 150 MG PO TABS: 150.0000 mg | ORAL_TABLET | Freq: Once | ORAL | 0 refills | Status: AC

## 2019-02-12 NOTE — Telephone Encounter (Signed)
Diflucan rx called in if it does get better needs to be seen in office

## 2019-02-12 NOTE — Telephone Encounter (Signed)
Can pt have an RX for this??

## 2019-02-12 NOTE — Telephone Encounter (Signed)
Pt calling c/o on antibx for cough/sinusitis; thinks she has a yeast inf - vagina is red, swollen and painful.  [facility] [address] [place].  [phone]

## 2019-06-05 DIAGNOSIS — Z0000 Encounter for general adult medical examination without abnormal findings: Secondary | ICD-10-CM | POA: Insufficient documentation

## 2019-06-05 DIAGNOSIS — E782 Mixed hyperlipidemia: Secondary | ICD-10-CM | POA: Insufficient documentation

## 2019-09-18 ENCOUNTER — Other Ambulatory Visit: Payer: Self-pay | Admitting: Internal Medicine

## 2019-09-18 DIAGNOSIS — Z1231 Encounter for screening mammogram for malignant neoplasm of breast: Secondary | ICD-10-CM

## 2019-12-08 ENCOUNTER — Ambulatory Visit
Admission: RE | Admit: 2019-12-08 | Discharge: 2019-12-08 | Disposition: A | Discharge status: Home / Self Care | Payer: Medicare Other | Source: Ambulatory Visit | Attending: Internal Medicine | Admitting: Internal Medicine

## 2019-12-08 DIAGNOSIS — Z1231 Encounter for screening mammogram for malignant neoplasm of breast: Secondary | ICD-10-CM | POA: Insufficient documentation

## 2019-12-29 ENCOUNTER — Encounter: Payer: Self-pay | Admitting: Obstetrics and Gynecology

## 2019-12-29 ENCOUNTER — Other Ambulatory Visit: Payer: Self-pay

## 2019-12-29 ENCOUNTER — Ambulatory Visit (INDEPENDENT_AMBULATORY_CARE_PROVIDER_SITE_OTHER): Payer: Medicare Other | Admitting: Obstetrics and Gynecology

## 2019-12-29 VITALS — BP 138/80 | Ht 66.0 in | Wt 169.0 lb

## 2019-12-29 DIAGNOSIS — Z0000 Encounter for general adult medical examination without abnormal findings: Secondary | ICD-10-CM

## 2019-12-29 DIAGNOSIS — Z01419 Encounter for gynecological examination (general) (routine) without abnormal findings: Secondary | ICD-10-CM | POA: Diagnosis not present

## 2019-12-29 NOTE — Progress Notes (Signed)
Gynecology Annual Exam  PCP: Rusty Aus, MD  Chief Complaint:  Chief Complaint  Patient presents with  . Gynecologic Exam    History of Present Illness:Patient is a 69 y.o. G0P0000 presents for annual exam. The patient has no complaints today.  She denies postmenopausal bleeding. The patient is sexually active. She denies dyspareunia.  The patient does perform self breast exams.  There is no notable family history of breast or ovarian cancer in her family.  The patient wears seatbelts: yes.   The patient has regular exercise: yes.    The patient denies current symptoms of depression.     Review of Systems: Review of Systems  Constitutional: Negative for chills, fever, malaise/fatigue and weight loss.  HENT: Negative for congestion, hearing loss and sinus pain.   Eyes: Negative for blurred vision and double vision.  Respiratory: Negative for cough, sputum production, shortness of breath and wheezing.   Cardiovascular: Negative for chest pain, palpitations, orthopnea and leg swelling.  Gastrointestinal: Negative for abdominal pain, constipation, diarrhea, nausea and vomiting.  Genitourinary: Negative for dysuria, flank pain, frequency, hematuria and urgency.  Musculoskeletal: Negative for back pain, falls and joint pain.  Skin: Negative for itching and rash.  Neurological: Negative for dizziness and headaches.  Psychiatric/Behavioral: Negative for depression, substance abuse and suicidal ideas. The patient is not nervous/anxious.     Past Medical History:  Patient Active Problem List   Diagnosis Date Noted  . Hyperlipidemia, mixed 06/05/2019  . Medicare annual wellness visit, initial 06/05/2019    Formatting of this note might be different from the original. 10/20   . Osteopenia of multiple sites 05/06/2018    Formatting of this note might be different from the original. 1/19 BD   . Acquired hypothyroidism 03/01/2016  . Pernicious anemia 07/27/2015  . Cervical  disc disease 12/22/2013    Formatting of this note might be different from the original. Cervical fusion, 2017   . OSA on CPAP 12/22/2013  . EXTERNAL HEMORRHOIDS 09/04/2007    Qualifier: Diagnosis of  By: Ardis Hughs MD, [place] OF COLON 09/04/2007    Qualifier: Diagnosis of  By: Ardis Hughs MD, Melene Plan      Past Surgical History:  Past Surgical History:  Procedure Laterality Date  . CHOLECYSTECTOMY    . COLONOSCOPY  2010   normal  . CRYOTHERAPY  1991  . ESOPHAGOGASTRODUODENOSCOPY ENDOSCOPY  03/04/2014  . NECK SURGERY  09/2015   degenerative disc     Gynecologic History:  No LMP recorded. Patient is postmenopausal. Last Pap: Results were: 2020 NIL and HR HPV negative  Last mammogram: 2021 Results were: BI-RAD I  Obstetric History: G0P0000  Family History:  Family History  Problem Relation Age of Onset  . Breast cancer Sister 75  . Stomach cancer Mother 74    Social History:  Social History   Socioeconomic History  . Marital status: Married    Spouse name: Not on file  . Number of children: Not on file  . Years of education: Not on file  . Highest education level: Not on file  Occupational History  . Not on file  Tobacco Use  . Smoking status: Never Smoker  . Smokeless tobacco: Never Used  Substance and Sexual Activity  . Alcohol use: No  . Drug use: No  . Sexual activity: Yes    Birth control/protection: Post-menopausal  Other Topics Concern  . Not on file  Social History Narrative  .  Not on file   Social Determinants of Health   Financial Resource Strain:   . Difficulty of Paying Living Expenses:   Food Insecurity:   . Worried About Charity fundraiser in the Last Year:   . Arboriculturist in the Last Year:   Transportation Needs:   . Film/video editor (Medical):   Marland Kitchen Lack of Transportation (Non-Medical):   Physical Activity:   . Days of Exercise per Week:   . Minutes of Exercise per Session:   Stress:   . Feeling of  Stress :   Social Connections:   . Frequency of Communication with Friends and Family:   . Frequency of Social Gatherings with Friends and Family:   . Attends Religious Services:   . Active Member of Clubs or Organizations:   . Attends Archivist Meetings:   Marland Kitchen Marital Status:   Intimate Partner Violence:   . Fear of Current or Ex-Partner:   . Emotionally Abused:   Marland Kitchen Physically Abused:   . Sexually Abused:     Allergies:  Allergies  Allergen Reactions  . Venlafaxine Shortness Of Breath    Medications: Prior to Admission medications   Medication Sig Start Date End Date Taking? Authorizing Provider  ALPRAZolam Duanne Moron) 0.5 MG tablet Take by mouth as needed.  11/18/14  Yes [provider]  citalopram (CELEXA) 20 MG tablet Take by mouth. 03/29/16  Yes [provider]  Cyanocobalamin (B-12) 1000 MCG LOZG Place under the tongue.   Yes [provider]  hydrochlorothiazide (HYDRODIURIL) 25 MG tablet Take 25 mg by mouth daily. for blood pressure 05/29/17  Yes [provider]  Multiple Vitamin (MULTIVITAMIN) tablet Take 1 tablet by mouth daily.   Yes [provider]  SYNTHROID 150 MCG tablet TAKE 1 TAB BY MOUTH ONCE DAILY ON AN EMPTY STOMACH WITH GLASS OF WATER 30-60 MINS BEFORE BREAKFAST 05/25/17  Yes [provider]  zolpidem (AMBIEN) 10 MG tablet as needed.  07/15/17  Yes [provider]    Physical Exam Vitals: Blood pressure 138/80, height 5\' 6"  (1.676 m), weight 169 lb (76.7 kg).  General: NAD HEENT: normocephalic, anicteric Thyroid: no enlargement, no palpable nodules Pulmonary: No increased work of breathing, CTAB Cardiovascular: RRR, distal pulses 2+ Breast: Breast symmetrical, no tenderness, no palpable nodules or masses, no skin or nipple retraction present, no nipple discharge.  No axillary or supraclavicular lymphadenopathy. Abdomen: NABS, soft, non-tender, non-distended.  Umbilicus without lesions.  No  hepatomegaly, splenomegaly or masses palpable. No evidence of hernia  Genitourinary:  External: Normal external female genitalia.  Normal urethral meatus, normal Bartholin's and Skene's glands.  Loss of pigmentation on vulva. Small atrophy of tissues  Vagina: Normal vaginal mucosa, no evidence of prolapse.    Cervix: Grossly normal in appearance, no bleeding  Uterus: Non-enlarged, mobile, normal contour.  No CMT  Adnexa: ovaries non-enlarged, no adnexal masses  Rectal: deferred  Lymphatic: no evidence of inguinal lymphadenopathy Extremities: no edema, erythema, or tenderness Neurologic: Grossly intact Psychiatric: mood appropriate, affect full Skinn: No concerning moles. Vitiligo skin changes diffusely.  Female chaperone present for pelvic and breast  portions of the physical exam     Assessment: 69 y.o. G0P0000 routine annual exam  Plan: Problem List Items Addressed This Visit    None    Visit Diagnoses    Health care maintenance    -  Primary   Encounter for annual routine gynecological examination  1) Mammogram - recommend yearly screening mammogram.  Mammogram Is up to date  2) STI screening  was not offered and therefore not obtained  3) ASCCP guidelines and rational discussed.  Patient opts for discontinue age >35 screening interval  4) Osteoporosis  - per USPTF routine screening DEXA at age 29 - FRAX 3 year major fracture risk 10%,  10 year hip fracture risk 1.6%  Consider FDA-approved medical therapies in postmenopausal women and men aged 84 years and older, based on the following: a) A hip or vertebral (clinical or morphometric) fracture b) T-score ? -2.5 at the femoral neck or spine after appropriate evaluation to exclude secondary causes C) Low bone mass (T-score between -1.0 and -2.5 at the femoral neck or spine) and a 10-year probability of a hip fracture ? 3% or a 10-year probability of a major osteoporosis-related fracture ? 20% based on the  [place]-adapted WHO algorithm   5) Routine healthcare maintenance including cholesterol, diabetes screening discussed managed by PCP  6) Colonoscopy done in 2020 with [facility] .  Screening recommended starting at age 52 for average risk individuals, age 65 for individuals deemed at increased risk (including African Americans) and recommended to continue until age 36.  For patient age 70-85 individualized approach is recommended.  Gold standard screening is via colonoscopy, Cologuard screening is an acceptable alternative for patient unwilling or unable to undergo colonoscopy.  "Colorectal cancer screening for average?risk adults: 2018 guideline update from the American Cancer Society"[place]: A Cancer Journal for Clinicians: Jan 24, 2017   7) Return in about 1 year (around 12/28/2020) for annual exam.   Adrian Prows MD [place] 12/29/2019 9:42 AM

## 2019-12-29 NOTE — Patient Instructions (Signed)
Institute of [place] for Calcium and Vitamin D  Age (yr) Calcium Recommended Dietary Allowance (mg/day) Vitamin D Recommended Dietary Allowance (international units/day)  9-18 1,300 600  19-50 1,000 600  51-70 1,200 600  71 and older 1,200 800  Data from Institute of Medicine. Dietary reference intakes: calcium, vitamin D. [place], [place]: [employer]; 2011.     Budget-Friendly Healthy Eating There are many ways to save money at the grocery store and continue to eat healthy. You can be successful if you:  Plan meals according to your budget.  Make a grocery list and only purchase food according to your grocery list.  Prepare food yourself. What are tips for following this plan?  Reading food labels  Compare food labels between brand name foods and the store brand. Often the nutritional value is the same, but the store brand is lower cost.  Look for products that do not have added sugar, fat, or salt (sodium). These often cost the same but are healthier for you. Products may be labeled as: ? Sugar-free. ? Nonfat. ? Low-fat. ? Sodium-free. ? Low-sodium.  Look for lean ground beef labeled as at least 92% lean and 8% fat. Shopping  Buy only the items on your grocery list and go only to the areas of the store that have the items on your list.  Use coupons only for foods and brands you normally buy. Avoid buying items you wouldn't normally buy simply because they are on sale.  Check online and in newspapers for weekly deals.  Buy healthy items from the bulk bins when available, such as herbs, spices, flour, pasta, nuts, and dried fruit.  Buy fruits and vegetables that are in season. Prices are usually lower on in-season produce.  Look at the unit price on the price tag. Use it to compare different brands and sizes to find out which item is the best deal.  Choose healthy items that are often low-cost, such as carrots,  potatoes, apples, bananas, and oranges. Dried or canned beans are a low-cost protein source.  Buy in bulk and freeze extra food. Items you can buy in bulk include meats, fish, poultry, frozen fruits, and frozen vegetables.  Avoid buying "ready-to-eat" foods, such as pre-cut fruits and vegetables and pre-made salads.  If possible, shop around to discover where you can find the best prices. Consider other retailers such as dollar stores, larger [employer], local fruit and vegetable stands, and farmers markets.  Do not shop when you are hungry. If you shop while hungry, it may be hard to stick to your list and budget.  Resist impulse buying. Use your grocery list as your official plan for the week.  Buy a variety of vegetables and fruits by purchasing fresh, frozen, and canned items.  Look at the top and bottom shelves for deals. Foods at eye level (eye level of an adult or child) are usually more expensive.  Be efficient with your time when shopping. The more time you spend at the store, the more money you are likely to spend.  To save money when choosing more expensive foods like meats and dairy: ? Choose cheaper cuts of meat, such as bone-in chicken thighs and drumsticks instead of skinless and boneless chicken. When you are ready to prepare the chicken, you can remove the skin yourself to make it healthier. ? Choose lean meats like chicken or [place] instead of beef. ? Choose canned seafood, such as tuna, salmon, or sardines. ? Buy  as a low-cost source of protein. ? Buy dried beans and peas, such as lentils, split peas, or kidney beans instead of meats. Dried beans and peas are a good alternative source of protein. ? Buy the larger tubs of yogurt instead of individual-sized containers.  Choose water instead of sodas and other sweetened beverages.  Avoid buying chips, cookies, and other "junk food." These items are usually expensive and not healthy. Cooking  Make extra food  and freeze the extras in meal-sized containers or in individual portions for fast meals and snacks.  Pre-cook on days when you have extra time to prepare meals in advance. You can keep these meals in the fridge or freezer and reheat for a quick meal.  When you come home from the grocery store, wash, peel, and cut fruits and vegetables so they are ready to use and eat. This will help reduce food waste. Meal planning  Do not eat out or get fast food. Prepare food at home.  Make a grocery list and make sure to bring it with you to the store. If you have a smart phone, you could use your phone to create your shopping list.  Plan meals and snacks according to a grocery list and budget you create.  Use leftovers in your meal plan for the week.  Look for recipes where you can cook once and make enough food for two meals.  Include budget-friendly meals like stews, casseroles, and stir-fry dishes.  Try some meatless meals or try "no cook" meals like salads.  Make sure that half your plate is filled with fruits or vegetables. Choose from fresh, frozen, or canned fruits and vegetables. If eating canned, remember to rinse them before eating. This will remove any excess salt added for packaging. Summary  Eating healthy on a budget is possible if you plan your meals according to your budget, purchase according to your budget and grocery list, and prepare food yourself.  Tips for buying more food on a limited budget include buying generic brands, using coupons only for foods you normally buy, and buying healthy items from the bulk bins when available.  Tips for buying cheaper food to replace expensive food include choosing cheaper, lean cuts of meat, and buying dried beans and peas. This information is not intended to replace advice given to you by your health care provider. Make sure you discuss any questions you have with your health care provider. Document Revised: 08/15/2017 Document Reviewed:  08/15/2017 Elsevier Patient Education  2020 Elsevier Inc.   Exercising to Stay Healthy To become healthy and stay healthy, it is recommended that you do moderate-intensity and vigorous-intensity exercise. You can tell that you are exercising at a moderate intensity if your heart starts beating faster and you start breathing faster but can still hold a conversation. You can tell that you are exercising at a vigorous intensity if you are breathing much harder and faster and cannot hold a conversation while exercising. Exercising regularly is important. It has many health benefits, such as:  Improving overall fitness, flexibility, and endurance.  Increasing bone density.  Helping with weight control.  Decreasing body fat.  Increasing muscle strength.  Reducing stress and tension.  Improving overall health. How often should I exercise? Choose an activity that you enjoy, and set realistic goals. Your health care provider can help you make an activity plan that works for you. Exercise regularly as told by your health care provider. This may include:  Doing strength training two times a   week, such as: ? Lifting weights. ? Using resistance bands. ? Push-ups. ? Sit-ups. ? Yoga.  Doing a certain intensity of exercise for a given amount of time. Choose from these options: ? A total of 150 minutes of moderate-intensity exercise every week. ? A total of 75 minutes of vigorous-intensity exercise every week. ? A mix of moderate-intensity and vigorous-intensity exercise every week. Children, pregnant women, people who have not exercised regularly, people who are overweight, and older adults may need to talk with a health care provider about what activities are safe to do. If you have a medical condition, be sure to talk with your health care provider before you start a new exercise program. What are some exercise ideas? Moderate-intensity exercise ideas include:  Walking 1 mile (1.6 km) in  about 15 minutes.  Biking.  Hiking.  Golfing.  Dancing.  Water aerobics. Vigorous-intensity exercise ideas include:  Walking 4.5 miles (7.2 km) or more in about 1 hour.  Jogging or running 5 miles (8 km) in about 1 hour.  Biking 10 miles (16.1 km) or more in about 1 hour.  Lap swimming.  Roller-skating or in-line skating.  Cross-country skiing.  Vigorous competitive sports, such as football, basketball, and soccer.  Jumping rope.  Aerobic dancing. What are some everyday activities that can help me to get exercise?  Yard work, such as: ? Pushing a lawn mower. ? Raking and bagging leaves.  Washing your car.  Pushing a stroller.  Shoveling snow.  Gardening.  Washing windows or floors. How can I be more active in my day-to-day activities?  Use stairs instead of an elevator.  Take a walk during your lunch break.  If you drive, park your car farther away from your work or school.  If you take public transportation, get off one stop early and walk the rest of the way.  Stand up or walk around during all of your indoor phone calls.  Get up, stretch, and walk around every 30 minutes throughout the day.  Enjoy exercise with a friend. Support to continue exercising will help you keep a regular routine of activity. What guidelines can I follow while exercising?  Before you start a new exercise program, talk with your health care provider.  Do not exercise so much that you hurt yourself, feel dizzy, or get very short of breath.  Wear comfortable clothes and wear shoes with good support.  Drink plenty of water while you exercise to prevent dehydration or heat stroke.  Work out until your breathing and your heartbeat get faster. Where to find more information  U.S. Department of Health and Human Services: www.hhs.gov  Centers for Disease Control and Prevention (CDC): www.cdc.gov Summary  Exercising regularly is important. It will improve your overall  fitness, flexibility, and endurance.  Regular exercise also will improve your overall health. It can help you control your weight, reduce stress, and improve your bone density.  Do not exercise so much that you hurt yourself, feel dizzy, or get very short of breath.  Before you start a new exercise program, talk with your health care provider. This information is not intended to replace advice given to you by your health care provider. Make sure you discuss any questions you have with your health care provider. Document Revised: 07/27/2017 Document Reviewed: 07/05/2017 Elsevier Patient Education  2020 Elsevier Inc.   Bone Health Bones protect organs, store calcium, anchor muscles, and support the whole body. Keeping your bones strong is important, especially as you get   older. You can take actions to help keep your bones strong and healthy. Why is keeping my bones healthy important?  Keeping your bones healthy is important because your body constantly replaces bone cells. Cells get old, and new cells take their place. As we age, we lose bone cells because the body may not be able to make enough new cells to replace the old cells. The amount of bone cells and bone tissue you have is referred to as bone mass. The higher your bone mass, the stronger your bones. The aging process leads to an overall loss of bone mass in the body, which can increase the likelihood of:  Joint pain and stiffness.  Broken bones.  A condition in which the bones become weak and brittle (osteoporosis). A large decline in bone mass occurs in older adults. In women, it occurs about the time of menopause. What actions can I take to keep my bones healthy? Good health habits are important for maintaining healthy bones. This includes eating nutritious foods and exercising regularly. To have healthy bones, you need to get enough of the right minerals and vitamins. Most nutrition experts recommend getting these nutrients from  the foods that you eat. In some cases, taking supplements may also be recommended. Doing certain types of exercise is also important for bone health. What are the nutritional recommendations for healthy bones?  Eating a well-balanced diet with plenty of calcium and vitamin D will help to protect your bones. Nutritional recommendations vary from person to person. Ask your health care provider what is healthy for you. Here are some general guidelines. Get enough calcium Calcium is the most important (essential) mineral for bone health. Most people can get enough calcium from their diet, but supplements may be recommended for people who are at risk for osteoporosis. Good sources of calcium include:  Dairy products, such as low-fat or nonfat milk, cheese, and yogurt.  Dark green leafy vegetables, such as bok choy and broccoli.  Calcium-fortified foods, such as orange juice, cereal, bread, soy beverages, and tofu products.  Nuts, such as almonds. Follow these recommended amounts for daily calcium intake:  Children, age 1-3: 700 mg.  Children, age 4-8: 1,000 mg.  Children, age 9-13: 1,300 mg.  Teens, age 14-18: 1,300 mg.  Adults, age 19-50: 1,000 mg.  Adults, age 51-70: ? Men: 1,000 mg. ? Women: 1,200 mg.  Adults, age 71 or older: 1,200 mg.  Pregnant and breastfeeding females: ? Teens: 1,300 mg. ? Adults: 1,000 mg. Get enough vitamin D Vitamin D is the most essential vitamin for bone health. It helps the body absorb calcium. Sunlight stimulates the skin to make vitamin D, so be sure to get enough sunlight. If you live in a cold climate or you do not get outside often, your health care provider may recommend that you take vitamin D supplements. Good sources of vitamin D in your diet include:  Egg yolks.  Saltwater fish.  Milk and cereal fortified with vitamin D. Follow these recommended amounts for daily vitamin D intake:  Children and teens, age 1-18: 600 international  units.  Adults, age 50 or younger: 400-800 international units.  Adults, age 51 or older: 800-1,000 international units. Get other important nutrients Other nutrients that are important for bone health include:  Phosphorus. This mineral is found in meat, poultry, dairy foods, nuts, and legumes. The recommended daily intake for adult men and adult women is 700 mg.  Magnesium. This mineral is found in seeds, nuts, dark green   vegetables, and legumes. The recommended daily intake for adult men is 400-420 mg. For adult women, it is 310-320 mg.  Vitamin K. This vitamin is found in green leafy vegetables. The recommended daily intake is 120 mg for adult men and 90 mg for adult women. What type of physical activity is best for building and maintaining healthy bones? Weight-bearing and strength-building activities are important for building and maintaining healthy bones. Weight-bearing activities cause muscles and bones to work against gravity. Strength-building activities increase the strength of the muscles that support bones. Weight-bearing and muscle-building activities include:  Walking and hiking.  Jogging and running.  Dancing.  Gym exercises.  Lifting weights.  Tennis and racquetball.  Climbing stairs.  Aerobics. Adults should get at least 30 minutes of moderate physical activity on most days. Children should get at least 60 minutes of moderate physical activity on most days. Ask your health care provider what type of exercise is best for you. How can I find out if my bone mass is low? Bone mass can be measured with an X-ray test called a bone mineral density (BMD) test. This test is recommended for all women who are age 65 or older. It may also be recommended for:  Men who are age 70 or older.  People who are at risk for osteoporosis because of: ? Having bones that break easily. ? Having a long-term disease that weakens bones, such as kidney disease or rheumatoid  arthritis. ? Having menopause earlier than normal. ? Taking medicine that weakens bones, such as steroids, thyroid hormones, or hormone treatment for breast cancer or prostate cancer. ? Smoking. ? Drinking three or more alcoholic drinks a day. If you find that you have a low bone mass, you may be able to prevent osteoporosis or further bone loss by changing your diet and lifestyle. Where can I find more information? For more information, check out the following websites:  National Osteoporosis Foundation: www.nof.org/patients  National Institutes of Health: www.bones.nih.gov  International Osteoporosis Foundation: www.iofbonehealth.org Summary  The aging process leads to an overall loss of bone mass in the body, which can increase the likelihood of broken bones and osteoporosis.  Eating a well-balanced diet with plenty of calcium and vitamin D will help to protect your bones.  Weight-bearing and strength-building activities are also important for building and maintaining strong bones.  Bone mass can be measured with an X-ray test called a bone mineral density (BMD) test. This information is not intended to replace advice given to you by your health care provider. Make sure you discuss any questions you have with your health care provider. Document Revised: 09/10/2017 Document Reviewed: 09/10/2017 Elsevier Patient Education  2020 Elsevier Inc.   

## 2020-08-02 ENCOUNTER — Ambulatory Visit: Payer: Medicare Other | Attending: Internal Medicine

## 2020-08-02 DIAGNOSIS — Z23 Encounter for immunization: Secondary | ICD-10-CM

## 2020-08-02 NOTE — Progress Notes (Signed)
   [facility]  Name:  Jill Hart    MRN: [id number] DOB: 08/06/1951  08/02/2020  [name] was observed post Covid-19 immunization for 15 minutes without incident. She was provided with Vaccine Information Sheet and instruction to access the V-Safe system.   [name] was instructed to call 911 with any severe reactions post vaccine: Marland Kitchen Difficulty breathing  . Swelling of face and throat  . A fast heartbeat  . A bad rash all over body  . Dizziness and weakness   Immunizations Administered    Name Date Dose VIS Date Route   Pfizer COVID-19 Vaccine 08/02/2020  1:02 PM 0.3 mL 06/16/2020 Intramuscular   Manufacturer: [place]   Lot: [id number]   [place]: [id number]

## 2020-11-19 ENCOUNTER — Other Ambulatory Visit: Payer: Self-pay | Admitting: Obstetrics and Gynecology

## 2020-11-19 DIAGNOSIS — Z1231 Encounter for screening mammogram for malignant neoplasm of breast: Secondary | ICD-10-CM

## 2021-01-11 ENCOUNTER — Ambulatory Visit (INDEPENDENT_AMBULATORY_CARE_PROVIDER_SITE_OTHER): Payer: Medicare Other | Admitting: Obstetrics and Gynecology

## 2021-01-11 ENCOUNTER — Other Ambulatory Visit: Payer: Self-pay

## 2021-01-11 ENCOUNTER — Encounter: Payer: Self-pay | Admitting: Obstetrics and Gynecology

## 2021-01-11 ENCOUNTER — Ambulatory Visit
Admission: RE | Admit: 2021-01-11 | Discharge: 2021-01-11 | Disposition: A | Discharge status: Home / Self Care | Payer: Medicare Other | Source: Ambulatory Visit | Attending: Obstetrics and Gynecology | Admitting: Obstetrics and Gynecology

## 2021-01-11 VITALS — BP 130/70 | Ht 66.0 in | Wt 169.0 lb

## 2021-01-11 DIAGNOSIS — Z0000 Encounter for general adult medical examination without abnormal findings: Secondary | ICD-10-CM | POA: Diagnosis not present

## 2021-01-11 DIAGNOSIS — Z01419 Encounter for gynecological examination (general) (routine) without abnormal findings: Secondary | ICD-10-CM | POA: Diagnosis not present

## 2021-01-11 DIAGNOSIS — Z1239 Encounter for other screening for malignant neoplasm of breast: Secondary | ICD-10-CM

## 2021-01-11 DIAGNOSIS — Z1231 Encounter for screening mammogram for malignant neoplasm of breast: Secondary | ICD-10-CM | POA: Diagnosis present

## 2021-01-11 NOTE — Patient Instructions (Signed)
Institute of Medicine Recommended Dietary Allowances for Calcium and Vitamin D  Age (yr) Calcium Recommended Dietary Allowance (mg/day) Vitamin D Recommended Dietary Allowance (international units/day)  9-18 1,300 600  19-50 1,000 600  51-70 1,200 600  71 and older 1,200 800  Data from Institute of Medicine. Dietary reference intakes: calcium, vitamin D. [place], [place]: National Academies Press; 2011.    Exercising to Stay Healthy To become healthy and stay healthy, it is recommended that you do moderate-intensity and vigorous-intensity exercise. You can tell that you are exercising at a moderate intensity if your heart starts beating faster and you start breathing faster but can still hold a conversation. You can tell that you are exercising at a vigorous intensity if you are breathing much harder and faster and cannot hold a conversation while exercising. Exercising regularly is important. It has many health benefits, such as:  Improving overall fitness, flexibility, and endurance.  Increasing bone density.  Helping with weight control.  Decreasing body fat.  Increasing muscle strength.  Reducing stress and tension.  Improving overall health. How often should I exercise? Choose an activity that you enjoy, and set realistic goals. Your health care provider can help you make an activity plan that works for you. Exercise regularly as told by your health care provider. This may include:  Doing strength training two times a week, such as: ? Lifting weights. ? Using resistance bands. ? Push-ups. ? Sit-ups. ? Yoga.  Doing a certain intensity of exercise for a given amount of time. Choose from these options: ? A total of 150 minutes of moderate-intensity exercise every week. ? A total of 75 minutes of vigorous-intensity exercise every week. ? A mix of moderate-intensity and vigorous-intensity exercise every week. Children, pregnant women, people who have not exercised  regularly, people who are overweight, and older adults may need to talk with a health care provider about what activities are safe to do. If you have a medical condition, be sure to talk with your health care provider before you start a new exercise program. What are some exercise ideas? Moderate-intensity exercise ideas include:  Walking 1 mile (1.6 km) in about 15 minutes.  Biking.  Hiking.  Golfing.  Dancing.  Water aerobics. Vigorous-intensity exercise ideas include:  Walking 4.5 miles (7.2 km) or more in about 1 hour.  Jogging or running 5 miles (8 km) in about 1 hour.  Biking 10 miles (16.1 km) or more in about 1 hour.  Lap swimming.  Roller-skating or in-line skating.  Cross-country skiing.  Vigorous competitive sports, such as football, basketball, and soccer.  Jumping rope.  Aerobic dancing.   What are some everyday activities that can help me to get exercise?  Yard work, such as: ? Pushing a lawn mower. ? Raking and bagging leaves.  Washing your car.  Pushing a stroller.  Shoveling snow.  Gardening.  Washing windows or floors. How can I be more active in my day-to-day activities?  Use stairs instead of an elevator.  Take a walk during your lunch break.  If you drive, park your car farther away from your work or school.  If you take public transportation, get off one stop early and walk the rest of the way.  Stand up or walk around during all of your indoor phone calls.  Get up, stretch, and walk around every 30 minutes throughout the day.  Enjoy exercise with a friend. Support to continue exercising will help you keep a regular routine of activity. What guidelines   can I follow while exercising?  Before you start a new exercise program, talk with your health care provider.  Do not exercise so much that you hurt yourself, feel dizzy, or get very short of breath.  Wear comfortable clothes and wear shoes with good support.  Drink plenty of  water while you exercise to prevent dehydration or heat stroke.  Work out until your breathing and your heartbeat get faster. Where to find more information  [place].S[place] Department of Health and Human Services: www.hhs.gov  Centers for Disease Control and Prevention (CDC): www.cdc.gov Summary  Exercising regularly is important. It will improve your overall fitness, flexibility, and endurance.  Regular exercise also will improve your overall health. It can help you control your weight, reduce stress, and improve your bone density.  Do not exercise so much that you hurt yourself, feel dizzy, or get very short of breath.  Before you start a new exercise program, talk with your health care provider. This information is not intended to replace advice given to you by your health care provider. Make sure you discuss any questions you have with your health care provider. Document Revised: 07/27/2017 Document Reviewed: 07/05/2017 Elsevier Patient Education  2021 Elsevier Inc.   Budget-Friendly Healthy Eating There are many ways to save money at the grocery store and continue to eat healthy. You can be successful if you:  Plan meals according to your budget.  Make a grocery list and only purchase food according to your grocery list.  Prepare food yourself at home. What are tips for following this plan? Reading food labels  Compare food labels between brand name foods and the store brand. Often the nutritional value is the same, but the store brand is lower cost.  Look for products that do not have added sugar, fat, or salt (sodium). These often cost the same but are healthier for you. Products may be labeled as: ? Sugar-free. ? Nonfat. ? Low-fat. ? Sodium-free. ? Low-sodium.  Look for lean ground beef labeled as at least 92% lean and 8% fat. Shopping  Buy only the items on your grocery list and go only to the areas of the store that have the items on your list.  Use coupons only for  foods and brands you normally buy. Avoid buying items you wouldn't normally buy simply because they are on sale.  Check online and in newspapers for weekly deals.  Buy healthy items from the bulk bins when available, such as herbs, spices, flour, pasta, nuts, and dried fruit.  Buy fruits and vegetables that are in season. Prices are usually lower on in-season produce.  Look at the unit price on the price tag. Use it to compare different brands and sizes to find out which item is the best deal.  Choose healthy items that are often low-cost, such as carrots, potatoes, apples, bananas, and oranges. Dried or canned beans are a low-cost protein source.  Buy in bulk and freeze extra food. Items you can buy in bulk include meats, fish, poultry, frozen fruits, and frozen vegetables.  Avoid buying "ready-to-eat" foods, such as pre-cut fruits and vegetables and pre-made salads.  If possible, shop around to discover where you can find the best prices. Consider other retailers such as dollar stores, larger wholesale stores, local fruit and vegetable stands, and farmers markets.  Do not shop when you are hungry. If you shop while hungry, it may be hard to stick to your list and budget.  Resist impulse buying. Use your grocery   list as your official plan for the week.  Buy a variety of vegetables and fruits by purchasing fresh, frozen, and canned items.  Look at the top and bottom shelves for deals. Foods at eye level (eye level of an adult or child) are usually more expensive.  Be efficient with your time when shopping. The more time you spend at the store, the more money you are likely to spend.  To save money when choosing more expensive foods like meats and dairy: ? Choose cheaper cuts of meat, such as bone-in chicken thighs and drumsticks instead of skinless and boneless chicken. When you are ready to prepare the chicken, you can remove the skin yourself to make it healthier. ? Choose lean meats  like chicken or turkey instead of beef. ? Choose canned seafood, such as tuna, salmon, or sardines. ? Buy eggs as a low-cost source of protein. ? Buy dried beans and peas, such as lentils, split peas, or kidney beans instead of meats. Dried beans and peas are a good alternative source of protein. ? Buy the larger tubs of yogurt instead of individual-sized containers.  Choose water instead of sodas and other sweetened beverages.  Avoid buying chips, cookies, and other "junk food." These items are usually expensive and not healthy.   Cooking  Make extra food and freeze the extras in meal-sized containers or in individual portions for fast meals and snacks.  Pre-cook on days when you have extra time to prepare meals in advance. You can keep these meals in the fridge or freezer and reheat for a quick meal.  When you come home from the grocery store, wash, peel, and cut fruits and vegetables so they are ready to use and eat. This will help reduce food waste. Meal planning  Do not eat out or get fast food. Prepare food at home.  Make a grocery list and make sure to bring it with you to the store. If you have a smart phone, you could use your phone to create your shopping list.  Plan meals and snacks according to a grocery list and budget you create.  Use leftovers in your meal plan for the week.  Look for recipes where you can cook once and make enough food for two meals.  Prepare budget-friendly types of meals like stews, casseroles, and stir-fry dishes.  Try some meatless meals or try "no cook" meals like salads.  Make sure that half your plate is filled with fruits or vegetables. Choose from fresh, frozen, or canned fruits and vegetables. If eating canned, remember to rinse them before eating. This will remove any excess salt added for packaging. Summary  Eating healthy on a budget is possible if you plan your meals according to your budget, purchase according to your budget and  grocery list, and prepare food yourself.  Tips for buying more food on a limited budget include buying generic brands, using coupons only for foods you normally buy, and buying healthy items from the bulk bins when available.  Tips for buying cheaper food to replace expensive food include choosing cheaper, lean cuts of meat, and buying dried beans and peas. This information is not intended to replace advice given to you by your health care provider. Make sure you discuss any questions you have with your health care provider. Document Revised: 05/27/2020 Document Reviewed: 05/27/2020 Elsevier Patient Education  2021 Elsevier Inc.   Bone Health Bones protect organs, store calcium, anchor muscles, and support the whole body. Keeping your bones   strong is important, especially as you get older. You can take actions to help keep your bones strong and healthy. Why is keeping my bones healthy important? Keeping your bones healthy is important because your body constantly replaces bone cells. Cells get old, and new cells take their place. As we age, we lose bone cells because the body may not be able to make enough new cells to replace the old cells. The amount of bone cells and bone tissue you have is referred to as bone mass. The higher your bone mass, the stronger your bones. The aging process leads to an overall loss of bone mass in the body, which can increase the likelihood of:  Joint pain and stiffness.  Broken bones.  A condition in which the bones become weak and brittle (osteoporosis). A large decline in bone mass occurs in older adults. In women, it occurs about the time of menopause.   What actions can I take to keep my bones healthy? Good health habits are important for maintaining healthy bones. This includes eating nutritious foods and exercising regularly. To have healthy bones, you need to get enough of the right minerals and vitamins. Most nutrition experts recommend getting these  nutrients from the foods that you eat. In some cases, taking supplements may also be recommended. Doing certain types of exercise is also important for bone health. What are the nutritional recommendations for healthy bones? Eating a well-balanced diet with plenty of calcium and vitamin D will help to protect your bones. Nutritional recommendations vary from person to person. Ask your health care provider what is healthy for you. Here are some general guidelines. Get enough calcium Calcium is the most important (essential) mineral for bone health. Most people can get enough calcium from their diet, but supplements may be recommended for people who are at risk for osteoporosis. Good sources of calcium include:  Dairy products, such as low-fat or nonfat milk, cheese, and yogurt.  Dark green leafy vegetables, such as bok choy and broccoli.  Calcium-fortified foods, such as orange juice, cereal, bread, soy beverages, and tofu products.  Nuts, such as almonds. Follow these recommended amounts for daily calcium intake:  Children, age 1-3: 700 mg.  Children, age 4-8: 1,000 mg.  Children, age 9-13: 1,300 mg.  Teens, age 14-18: 1,300 mg.  Adults, age 19-50: 1,000 mg.  Adults, age 51-70: ? Men: 1,000 mg. ? Women: 1,200 mg.  Adults, age 71 or older: 1,200 mg.  Pregnant and breastfeeding females: ? Teens: 1,300 mg. ? Adults: 1,000 mg. Get enough vitamin D Vitamin D is the most essential vitamin for bone health. It helps the body absorb calcium. Sunlight stimulates the skin to make vitamin D, so be sure to get enough sunlight. If you live in a cold climate or you do not get outside often, your health care provider may recommend that you take vitamin D supplements. Good sources of vitamin D in your diet include:  Egg yolks.  Saltwater fish.  Milk and cereal fortified with vitamin D. Follow these recommended amounts for daily vitamin D intake:  Children and teens, age 1-18: 600  international units.  Adults, age 50 or younger: 400-800 international units.  Adults, age 51 or older: 800-1,000 international units. Get other important nutrients Other nutrients that are important for bone health include:  Phosphorus. This mineral is found in meat, poultry, dairy foods, nuts, and legumes. The recommended daily intake for adult men and adult women is 700 mg.  Magnesium. This mineral   is found in seeds, nuts, dark green vegetables, and legumes. The recommended daily intake for adult men is 400-420 mg. For adult women, it is 310-320 mg.  Vitamin K. This vitamin is found in green leafy vegetables. The recommended daily intake is 120 mg for adult men and 90 mg for adult women.   What type of physical activity is best for building and maintaining healthy bones? Weight-bearing and strength-building activities are important for building and maintaining healthy bones. Weight-bearing activities cause muscles and bones to work against gravity. Strength-building activities increase the strength of the muscles that support bones. Weight-bearing and muscle-building activities include:  Walking and hiking.  Jogging and running.  Dancing.  Gym exercises.  Lifting weights.  Tennis and racquetball.  Climbing stairs.  Aerobics. Adults should get at least 30 minutes of moderate physical activity on most days. Children should get at least 60 minutes of moderate physical activity on most days. Ask your health care provider what type of exercise is best for you.   How can I find out if my bone mass is low? Bone mass can be measured with an X-ray test called a bone mineral density (BMD) test. This test is recommended for all women who are age 65 or older. It may also be recommended for:  Men who are age 70 or older.  People who are at risk for osteoporosis because of: ? Having bones that break easily. ? Having a long-term disease that weakens bones, such as kidney disease or  rheumatoid arthritis. ? Having menopause earlier than normal. ? Taking medicine that weakens bones, such as steroids, thyroid hormones, or hormone treatment for breast cancer or prostate cancer. ? Smoking. ? Drinking three or more alcoholic drinks a day. If you find that you have a low bone mass, you may be able to prevent osteoporosis or further bone loss by changing your diet and lifestyle. Where can I find more information? For more information, check out the following websites:  National Osteoporosis Foundation: www.nof.org/patients  National Institutes of Health: www.bones.nih.gov  International Osteoporosis Foundation: www.iofbonehealth.org Summary  The aging process leads to an overall loss of bone mass in the body, which can increase the likelihood of broken bones and osteoporosis.  Eating a well-balanced diet with plenty of calcium and vitamin D will help to protect your bones.  Weight-bearing and strength-building activities are also important for building and maintaining strong bones.  Bone mass can be measured with an X-ray test called a bone mineral density (BMD) test. This information is not intended to replace advice given to you by your health care provider. Make sure you discuss any questions you have with your health care provider. Document Revised: 09/10/2017 Document Reviewed: 09/10/2017 Elsevier Patient Education  2021 Elsevier Inc.   

## 2021-01-11 NOTE — Progress Notes (Signed)
Gynecology Annual Exam  PCP: Rusty Aus, MD  Chief Complaint:  Chief Complaint  Patient presents with  . Gynecologic Exam    History of Present Illness: Patient is a 70 y.o. G0P0000 presents for annual exam. The patient has no complaints today.   LMP: No LMP recorded. Patient is postmenopausal. She denies postmenopausal bleeding or spotting  The patient is sexually active. She denies dyspareunia.  Postcoital Bleeding: no   The patient does perform self breast exams.  There is notable family history of breast or ovarian cancer in her family.  The patient has regular exercise: works in her yard at home  The patient denies current symptoms of depression.   PHQ-9: 4 GAD-7: 0   Review of Systems: ROS  Past Medical History:  Past Medical History:  Diagnosis Date  . Anxiety   . Atrophic vaginitis   . Depression   . Hypertension   . Insomnia   . Sleep apnea   . Thyroid disease   . Vitiligo     Past Surgical History:  Past Surgical History:  Procedure Laterality Date  . CHOLECYSTECTOMY    . COLONOSCOPY  2010   normal  . CRYOTHERAPY  1991  . ESOPHAGOGASTRODUODENOSCOPY ENDOSCOPY  03/04/2014  . NECK SURGERY  09/2015   degenerative disc     Gynecologic History:  No LMP recorded. Patient is postmenopausal. Last Pap: Results were: 2020 NIL  Last mammogram: 2021  Results were: BI-RAD I  Obstetric History: G0P0000  Family History:  Family History  Problem Relation Age of Onset  . Breast cancer Sister 72  . Stomach cancer Mother 44    Social History:  Social History   Socioeconomic History  . Marital status: Married    Spouse name: Not on file  . Number of children: Not on file  . Years of education: Not on file  . Highest education level: Not on file  Occupational History  . Not on file  Tobacco Use  . Smoking status: Never Smoker  . Smokeless tobacco: Never Used  Vaping Use  . Vaping Use: Never used  Substance and Sexual Activity  . Alcohol  use: No  . Drug use: No  . Sexual activity: Yes    Birth control/protection: Post-menopausal  Other Topics Concern  . Not on file  Social History Narrative  . Not on file   Social Determinants of Health   Financial Resource Strain: Not on file  Food Insecurity: Not on file  Transportation Needs: Not on file  Physical Activity: Not on file  Stress: Not on file  Social Connections: Not on file  Intimate Partner Violence: Not on file    Allergies:  Allergies  Allergen Reactions  . Venlafaxine Shortness Of Breath    Medications: Prior to Admission medications   Medication Sig Start Date End Date Taking? Authorizing Provider  ALPRAZolam Duanne Moron) 0.5 MG tablet Take by mouth as needed.  11/18/14  Yes [provider]  citalopram (CELEXA) 20 MG tablet Take by mouth. 03/29/16  Yes [provider]  Cyanocobalamin (B-12) 1000 MCG LOZG Place under the tongue.   Yes [provider]  hydrochlorothiazide (HYDRODIURIL) 25 MG tablet Take 25 mg by mouth daily. for blood pressure 05/29/17  Yes [provider]  Multiple Vitamin (MULTIVITAMIN) tablet Take 1 tablet by mouth daily.   Yes [provider]  SYNTHROID 150 MCG tablet TAKE 1 TAB BY MOUTH ONCE DAILY ON AN EMPTY STOMACH WITH GLASS OF WATER 30-60 MINS  BEFORE BREAKFAST 05/25/17  Yes [provider]  zolpidem (AMBIEN) 10 MG tablet as needed.  07/15/17  Yes [provider]    Physical Exam Vitals: Blood pressure 130/70, height 5\' 6"  (1.676 m), weight 169 lb (76.7 kg).  Physical Exam Constitutional:      Appearance: She is well-developed.  Genitourinary:     Genitourinary Comments: External: Normal appearing vulva. Diffuse vitiligo, Seen image note  Speculum examination: Normal appearing cervix. No blood in the vaginal vault. No discharge.   Bimanual examination: Uterus midline, non-tender, normal in size, shape and contour.  No CMT. No adnexal masses. No adnexal tenderness. Pelvis  not fixed.  Breast Exam: breast equal without skin changes, nipple discharge, breast lump or enlarged lymph nodes      HENT:     Head: Normocephalic and atraumatic.  Neck:     Thyroid: No thyromegaly.  Cardiovascular:     Rate and Rhythm: Normal rate and regular rhythm.     Heart sounds: Normal heart sounds.  Pulmonary:     Effort: Pulmonary effort is normal.     Breath sounds: Normal breath sounds.  Abdominal:     General: Bowel sounds are normal. There is no distension.     Palpations: Abdomen is soft. There is no mass.  Musculoskeletal:     Cervical back: Neck supple.  Neurological:     Mental Status: She is alert and oriented to person, place, and time.  Skin:    General: Skin is warm and dry.  Psychiatric:        Behavior: Behavior normal.        Thought Content: Thought content normal.        Judgment: Judgment normal.  Vitals reviewed.      Female chaperone present for pelvic and breast  portions of the physical exam  Assessment: 70 y.o. G0P0000 routine annual exam  Plan: Problem List Items Addressed This Visit   None   Visit Diagnoses    Health care maintenance    -  Primary   Encounter for annual routine gynecological examination       Encounter for gynecological examination without abnormal finding       Encounter for screening breast examination       Breast cancer screening by mammogram          1) Mammogram - recommend yearly screening mammogram.  Mammogram Was ordered today  2) STI screening was offered and declined  3) ASCCP guidelines and rational discussed.  Patient opts for discontinuation after 65  4) Colonoscopy -- 2021 per patient- could not see report in care everywhere.   5) Routine healthcare maintenance including cholesterol, diabetes screening discussed managed by PCP  6) Osteoporosis screening - history of osteopenia, repeat Dexa scan in 2024.  7) Follow up in 6 months to monitor vulvar biopsy site.  Adrian Prows MD,  [facility], [facility] 01/11/2021 1:10 PM

## 2021-02-15 IMAGING — MG DIGITAL SCREENING BILAT W/ TOMO W/ CAD
8 series · 8 of 24 positions shown · non-contrast
Comparison: Previous exam(s).

CLINICAL DATA: Screening.

EXAM:
DIGITAL SCREENING BILATERAL MAMMOGRAM WITH TOMO AND CAD

[L CC synth-2D]
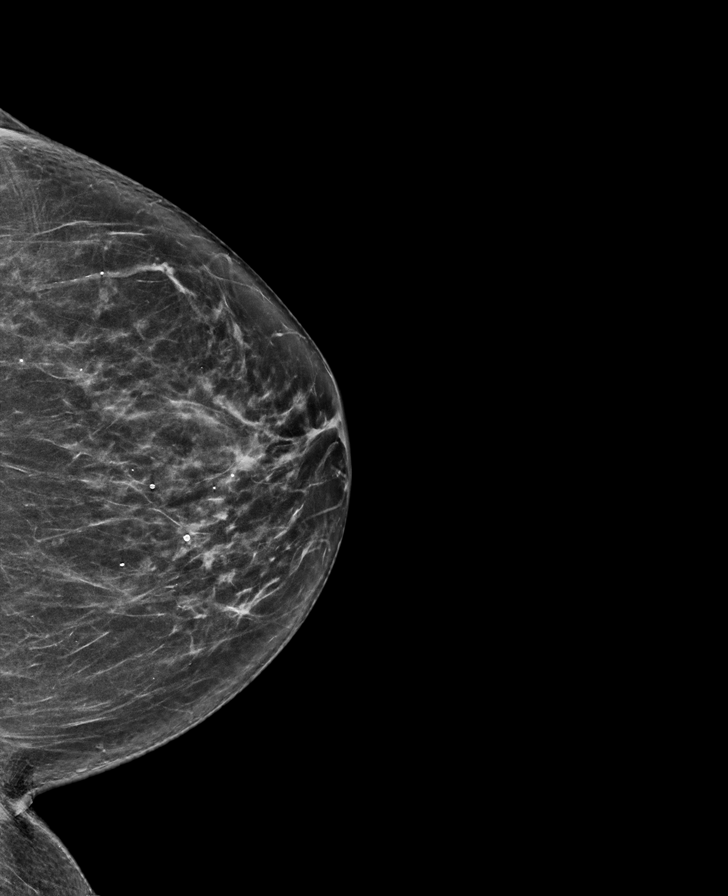

[R CC synth-2D]
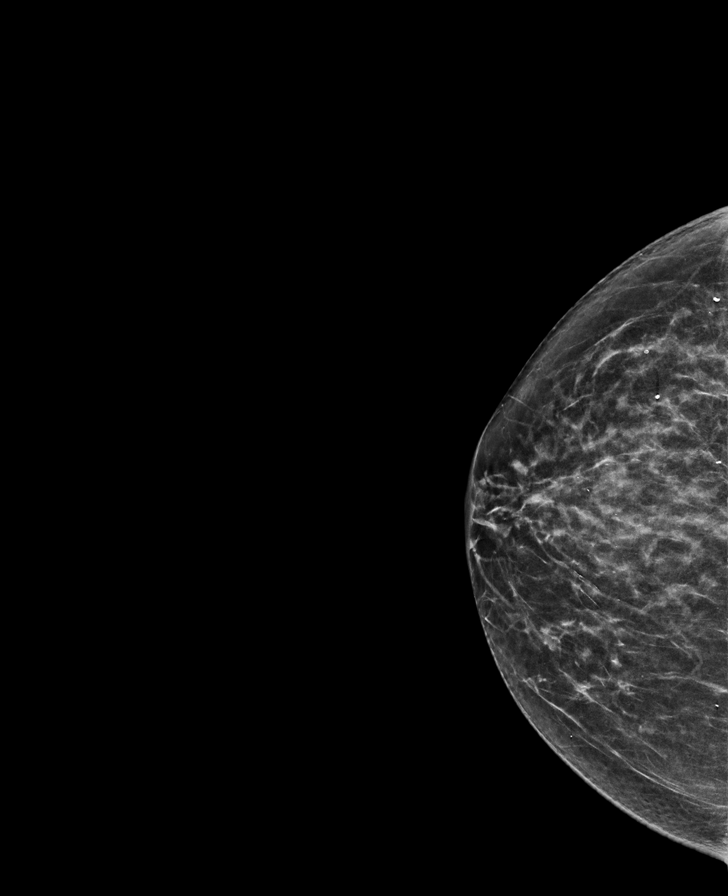

[R MLO synth-2D]
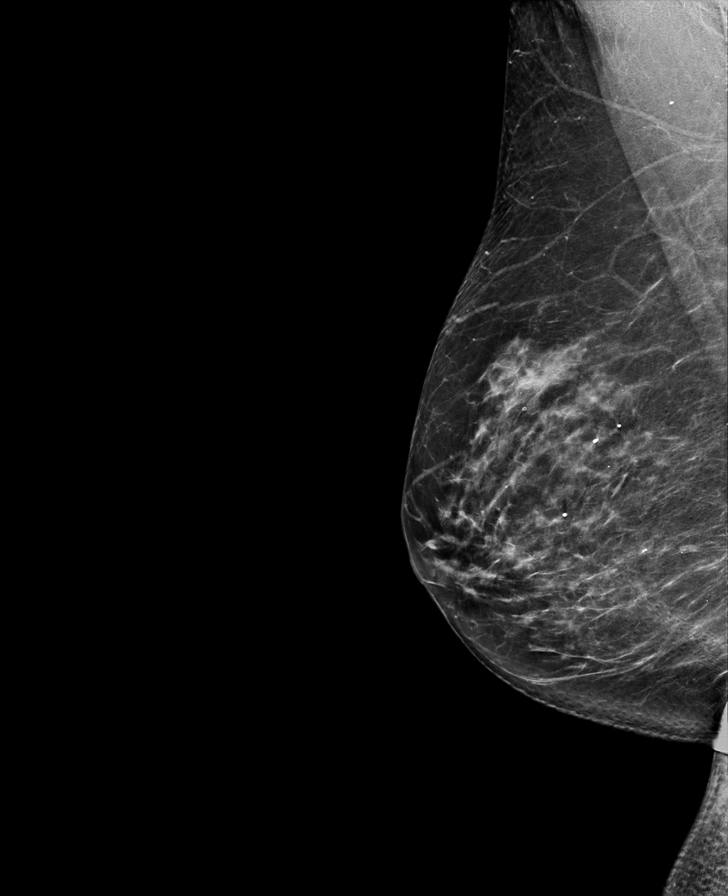

[L MLO synth-2D]
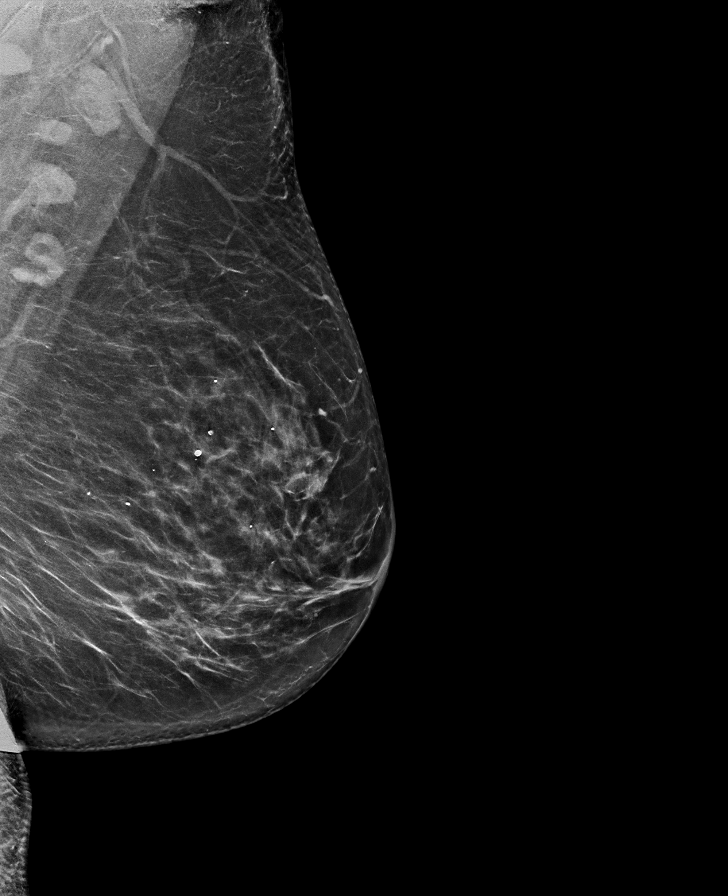

[R CC tomo · tomo slice 30/59.0]
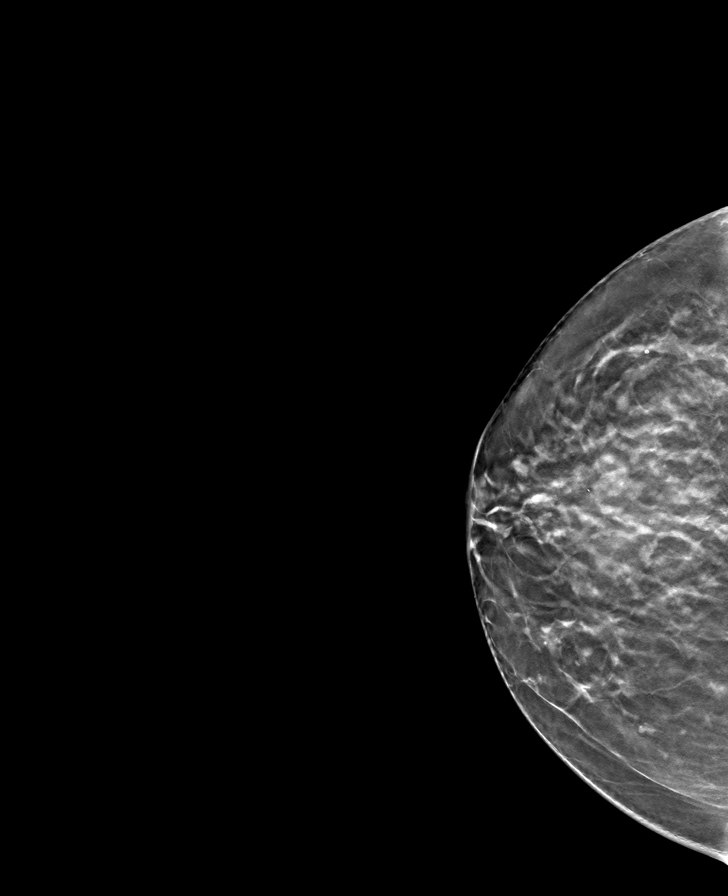

[R MLO tomo · tomo slice 35/70.0]
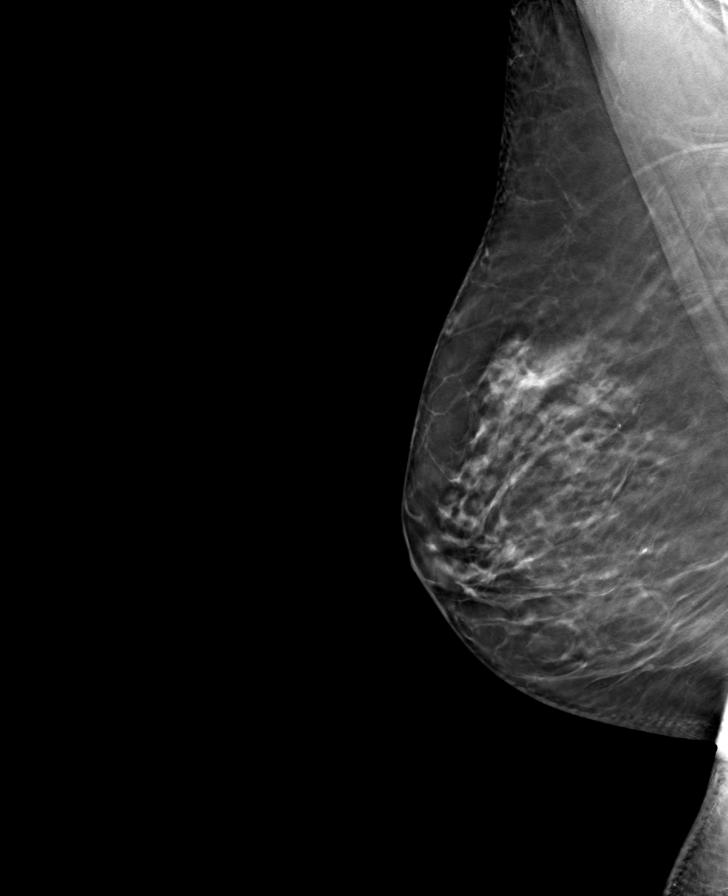

[L CC tomo · tomo slice 35/68.0]
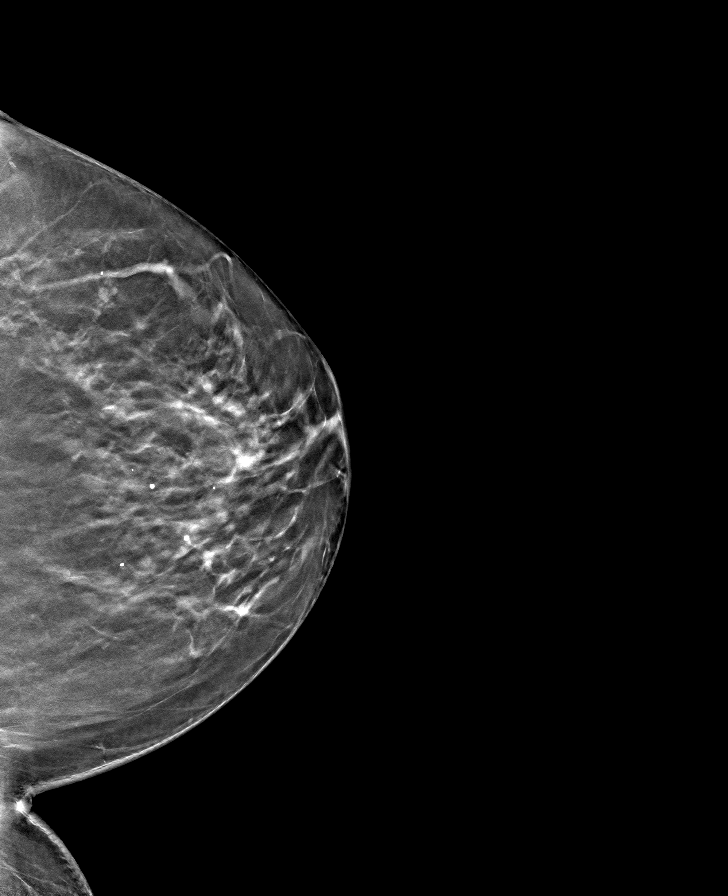

[L MLO tomo · tomo slice 37/72.0]
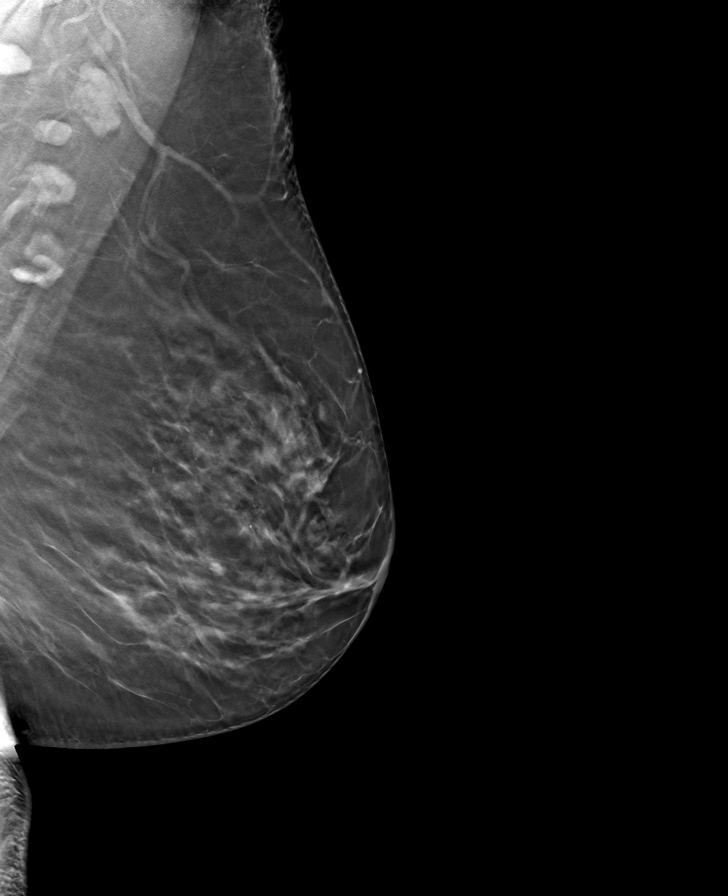

[8 of 24 positions shown; findings below may reference images not displayed]

ACR Breast Density Category c: The breast tissue is heterogeneously
dense, which may obscure small masses.
FINDINGS: There are no findings suspicious for malignancy. Images were
processed with CAD.
IMPRESSION: No mammographic evidence of malignancy. A result letter of this
screening mammogram will be mailed directly to the patient.

RECOMMENDATION:
Screening mammogram in one year. (Code:[id number])

BI-RADS CATEGORY  1: Negative.

## 2021-12-21 ENCOUNTER — Telehealth: Payer: Self-pay | Admitting: Family Medicine

## 2021-12-21 NOTE — Telephone Encounter (Signed)
Left message for patient to call office to make annual for May ?

## 2022-01-24 ENCOUNTER — Ambulatory Visit: Payer: Medicare Other | Admitting: Obstetrics and Gynecology

## 2022-01-24 ENCOUNTER — Encounter: Payer: Self-pay | Admitting: Obstetrics and Gynecology

## 2022-01-24 VITALS — BP 110/72 | Ht 66.0 in | Wt 165.0 lb

## 2022-01-24 DIAGNOSIS — R310 Gross hematuria: Secondary | ICD-10-CM

## 2022-01-24 LAB — POCT URINALYSIS DIPSTICK
Bilirubin, UA: NEGATIVE
Glucose, UA: NEGATIVE
Ketones, UA: NEGATIVE
Nitrite, UA: NEGATIVE
Protein, UA: NEGATIVE
Spec Grav, UA: 1.025 (ref 1.010–1.025)
pH, UA: 6 (ref 5.0–8.0)

## 2022-01-24 NOTE — Progress Notes (Signed)
Rusty Aus, MD   Chief Complaint  Patient presents with   Urinary Tract Infection    Burning urinating and blood in urine started this morning    HPI:      [name] is a 71 y.o. G0P0000 whose LMP was No LMP recorded. Patient is postmenopausal., presents today for dysuria and blood with wiping this morning twice, no sx since. No urgency, frequency, LBP, pelvic pain, fevers. No vag sx. She is sex active, last time a few days ago. Has dryness, no bleeding with sex.  No PMB, neg pap 11/18. Has annual 6/23.    Patient Active Problem List   Diagnosis Date Noted   Hyperlipidemia, mixed 06/05/2019   Medicare annual wellness visit, initial 06/05/2019   Osteopenia of multiple sites 05/06/2018   Acquired hypothyroidism 03/01/2016   Pernicious anemia 07/27/2015   Cervical disc disease 12/22/2013   OSA on CPAP 12/22/2013   EXTERNAL HEMORRHOIDS 09/04/2007   DIVERTICULOSIS OF COLON 09/04/2007    Past Surgical History:  Procedure Laterality Date   CHOLECYSTECTOMY     COLONOSCOPY  2010   normal   CRYOTHERAPY  1991   ESOPHAGOGASTRODUODENOSCOPY ENDOSCOPY  03/04/2014   NECK SURGERY  09/2015   degenerative disc     Family History  Problem Relation Age of Onset   Stomach cancer Mother 62   Breast cancer Sister 9    Social History   Socioeconomic History   Marital status: Married    Spouse name: Not on file   Number of children: Not on file   Years of education: Not on file   Highest education level: Not on file  Occupational History   Not on file  Tobacco Use   Smoking status: Never   Smokeless tobacco: Never  Vaping Use   Vaping Use: Never used  Substance and Sexual Activity   Alcohol use: No   Drug use: No   Sexual activity: Yes    Birth control/protection: Post-menopausal  Other Topics Concern   Not on file  Social History Narrative   Not on file   Social Determinants of Health   Financial Resource Strain: Not on file  Food Insecurity:  Not on file  Transportation Needs: Not on file  Physical Activity: Not on file  Stress: Not on file  Social Connections: Not on file  Intimate Partner Violence: Not on file    Outpatient Medications Prior to Visit  Medication Sig Dispense Refill   citalopram (CELEXA) 20 MG tablet Take by mouth.     Cyanocobalamin (B-12) 1000 MCG LOZG Place under the tongue.     hydrochlorothiazide (HYDRODIURIL) 25 MG tablet Take 25 mg by mouth daily. for blood pressure  3   metoprolol succinate (TOPROL-XL) 25 MG 24 hr tablet Take 25 mg by mouth daily.     Multiple Vitamin (MULTIVITAMIN) tablet Take 1 tablet by mouth daily.     SYNTHROID 150 MCG tablet TAKE 1 TAB BY MOUTH ONCE DAILY ON AN EMPTY STOMACH WITH GLASS OF WATER 30-60 MINS BEFORE BREAKFAST  3   ALPRAZolam (XANAX) 0.5 MG tablet Take by mouth as needed.      zolpidem (AMBIEN) 10 MG tablet as needed.   5   No facility-administered medications prior to visit.      ROS:  Review of Systems  Constitutional:  Negative for fatigue, fever and unexpected weight change.  Respiratory:  Negative for cough, shortness of breath and wheezing.   Cardiovascular:  Negative for chest  pain, palpitations and leg swelling.  Gastrointestinal:  Negative for blood in stool, constipation, diarrhea, nausea and vomiting.  Endocrine: Negative for cold intolerance, heat intolerance and polyuria.  Genitourinary:  Positive for vaginal bleeding. Negative for dyspareunia, dysuria, flank pain, frequency, genital sores, hematuria, menstrual problem, pelvic pain, urgency, vaginal discharge and vaginal pain.  Musculoskeletal:  Negative for back pain, joint swelling and myalgias.  Skin:  Negative for rash.  Neurological:  Negative for dizziness, syncope, light-headedness, numbness and headaches.  Hematological:  Negative for adenopathy.  Psychiatric/Behavioral:  Negative for agitation, confusion, sleep disturbance and suicidal ideas. The patient is not nervous/anxious.    BREAST: No symptoms   OBJECTIVE:   Vitals:  BP 110/72   Ht '5\' 6"'$  (1.676 m)   Wt 165 lb (74.8 kg)   BMI 26.63 kg/m   Physical Exam Vitals reviewed.  Constitutional:      Appearance: She is well-developed.  Pulmonary:     Effort: Pulmonary effort is normal.  Genitourinary:    Pubic Area: No rash.      Labia:        Right: Lesion present. No rash or tenderness.        Left: No rash, tenderness or lesion.      Vagina: Normal. No vaginal discharge, erythema, tenderness or bleeding.     Cervix: No cervical bleeding.     Uterus: Normal. Not enlarged and not tender.      Adnexa: Right adnexa normal and left adnexa normal.       Right: No mass or tenderness.         Left: No mass or tenderness.      Musculoskeletal:        General: Normal range of motion.     Cervical back: Normal range of motion.  Skin:    General: Skin is warm and dry.  Neurological:     General: No focal deficit present.     Mental Status: She is alert and oriented to person, place, and time.  Psychiatric:        Mood and Affect: Mood normal.        Behavior: Behavior normal.        Thought Content: Thought content normal.        Judgment: Judgment normal.    Results: Results for orders placed or performed in visit on 01/24/22 (from the past 24 hour(s))  POCT Urinalysis Dipstick     Status: Abnormal   Collection Time: 01/24/22 11:44 AM  Result Value Ref Range   Color, UA YELLOW    Clarity, UA clear    Glucose, UA Negative Negative   Bilirubin, UA neg    Ketones, UA neg    Spec Grav, UA 1.025 1.010 - 1.025   Blood, UA large    pH, UA 6.0 5.0 - 8.0   Protein, UA Negative Negative   Urobilinogen, UA     Nitrite, UA neg    Leukocytes, UA Small (1+) (A) Negative   Appearance     Odor       Assessment/Plan: Gross hematuria - Plan: Urine Culture, POCT Urinalysis Dipstick; pos UA but no other UTI sx. Check C&S but most likely due to vaginal lesion that probably bled this AM. Leave alone since  looks like resolving. Has annual and can f/u 02/01/22.     Return if symptoms worsen or fail to improve.  Anvi Mangal B. Ayman Brull, PA-C 01/24/2022 11:48 AM

## 2022-01-25 ENCOUNTER — Telehealth: Payer: Self-pay

## 2022-01-25 NOTE — Telephone Encounter (Signed)
Pt left message stating she seen [facility] yesterday and thought she had a UTI was needing the antibiotic. I advised her that we are waiting on the culture results so we can know what medication will treat it. The urine dip in office showed small leukocytes. [facility] did not mention a rx to send in. Pt also advised on trying AZO for relief in the mean time. She only reports having pain once last night.

## 2022-01-26 ENCOUNTER — Other Ambulatory Visit: Payer: Self-pay | Admitting: Internal Medicine

## 2022-01-26 DIAGNOSIS — Z1231 Encounter for screening mammogram for malignant neoplasm of breast: Secondary | ICD-10-CM

## 2022-01-26 LAB — URINE CULTURE

## 2022-01-30 ENCOUNTER — Telehealth: Payer: Self-pay

## 2022-01-30 MED ORDER — SULFAMETHOXAZOLE-TRIMETHOPRIM 800-160 MG PO TABS: 1.0000 | ORAL_TABLET | Freq: Two times a day (BID) | ORAL | 0 refills | Status: AC

## 2022-01-30 NOTE — Telephone Encounter (Signed)
I had called pt last Friday 01/27/22 to f/u on her UTI sx given [facility]s notes on pt's last visit. She didn't answer so I left voice mail and she called back this morning. She said no more blood in urine but she still has some burning when she urinates. Per our new protocol, I will call in Bactrim Rx. Pt aware Rx sent.

## 2022-02-01 ENCOUNTER — Encounter: Payer: Self-pay | Admitting: Obstetrics and Gynecology

## 2022-02-01 ENCOUNTER — Ambulatory Visit (INDEPENDENT_AMBULATORY_CARE_PROVIDER_SITE_OTHER): Payer: Medicare Other | Admitting: Obstetrics and Gynecology

## 2022-02-01 VITALS — BP 130/80 | Ht 66.0 in | Wt 165.0 lb

## 2022-02-01 DIAGNOSIS — Z01419 Encounter for gynecological examination (general) (routine) without abnormal findings: Secondary | ICD-10-CM | POA: Diagnosis not present

## 2022-02-01 NOTE — Progress Notes (Signed)
Subjective:     Jill Hart is a 71 y.o. female postmenopausal with BMI 26 who is here for a comprehensive physical exam. The patient reports no problems. She denies any episodes of postmenopausal vaginal bleeding. She denies pelvic pain or abnormal discharge. She is sexually active using water based lubricant for occasional dyspareunia. She reports significant improvement in her dysuria since starting antibiotics. She denies urinary incontinence. She is without any complaints  Past Medical History:  Diagnosis Date   Anxiety    Atrophic vaginitis    Depression    Hypertension    Insomnia    Sleep apnea    Thyroid disease    Vitiligo    Past Surgical History:  Procedure Laterality Date   CHOLECYSTECTOMY     COLONOSCOPY  2010   normal   CRYOTHERAPY  1991   ESOPHAGOGASTRODUODENOSCOPY ENDOSCOPY  03/04/2014   NECK SURGERY  09/2015   degenerative disc    Family History  Problem Relation Age of Onset   Stomach cancer Mother 20   Breast cancer Sister 39    Social History   Socioeconomic History   Marital status: Married    Spouse name: Not on file   Number of children: Not on file   Years of education: Not on file   Highest education level: Not on file  Occupational History   Not on file  Tobacco Use   Smoking status: Never   Smokeless tobacco: Never  Vaping Use   Vaping Use: Never used  Substance and Sexual Activity   Alcohol use: No   Drug use: No   Sexual activity: Yes    Birth control/protection: Post-menopausal  Other Topics Concern   Not on file  Social History Narrative   Not on file   Social Determinants of Health   Financial Resource Strain: Not on file  Food Insecurity: Not on file  Transportation Needs: Not on file  Physical Activity: Not on file  Stress: Not on file  Social Connections: Not on file  Intimate Partner Violence: Not on file   Health Maintenance  Topic Date Due   Hepatitis C Screening  Never done   Pneumonia Vaccine 34+  Years old (3) 06/18/2019   COVID-19 Vaccine (4 - Booster for [place] series) 09/27/2020   INFLUENZA VACCINE  03/28/2022   MAMMOGRAM  01/12/2023   TETANUS/TDAP  06/04/2029   COLONOSCOPY (Pts 45-90yr Insurance coverage will need to be confirmed)  08/28/2029   DEXA SCAN  Completed   Zoster Vaccines- Shingrix  Completed   HPV VACCINES  Aged Out       Review of Systems Pertinent items noted in HPI and remainder of comprehensive ROS otherwise negative.   Objective:  Blood pressure 130/80, height '5\' 6"'$  (1.676 m), weight 165 lb (74.8 kg).   GENERAL: Well-developed, well-nourished female in no acute distress.  HEENT: Normocephalic, atraumatic. Sclerae anicteric.  NECK: Supple. Normal thyroid.  LUNGS: Clear to auscultation bilaterally.  HEART: Regular rate and rhythm. BREASTS: Symmetric in size. No palpable masses or lymphadenopathy, skin changes, or nipple drainage. ABDOMEN: Soft, nontender, nondistended. No organomegaly. PELVIC: Normal external female genitalia. Vagina is pale and atrophic.  Normal discharge. Normal appearing cervix. Uterus is normal in size. No adnexal mass or tenderness. Chaperone present during the pelvic exam EXTREMITIES: No cyanosis, clubbing, or edema, 2+ distal pulses.     Assessment:    Healthy female exam.      Plan:    Pap smear no longer indicated Screening mammogram scheduled  this month Patient is current on Dexa and Colonoscopy Follow up with PCP for health maintenance labs Complete antibiotics for UTI See After Visit Summary for Counseling Recommendations

## 2022-02-23 ENCOUNTER — Ambulatory Visit
Admission: RE | Admit: 2022-02-23 | Discharge: 2022-02-23 | Disposition: A | Discharge status: Home / Self Care | Payer: Medicare Other | Source: Ambulatory Visit | Attending: Internal Medicine | Admitting: Internal Medicine

## 2022-02-23 DIAGNOSIS — Z1231 Encounter for screening mammogram for malignant neoplasm of breast: Secondary | ICD-10-CM | POA: Diagnosis present

## 2022-03-02 ENCOUNTER — Other Ambulatory Visit: Payer: Self-pay | Admitting: Internal Medicine

## 2022-03-02 DIAGNOSIS — R928 Other abnormal and inconclusive findings on diagnostic imaging of breast: Secondary | ICD-10-CM

## 2022-03-02 DIAGNOSIS — N63 Unspecified lump in unspecified breast: Secondary | ICD-10-CM

## 2022-03-15 ENCOUNTER — Ambulatory Visit
Admission: RE | Admit: 2022-03-15 | Discharge: 2022-03-15 | Disposition: A | Discharge status: Home / Self Care | Payer: Medicare Other | Source: Ambulatory Visit | Attending: Internal Medicine | Admitting: Internal Medicine

## 2022-03-15 DIAGNOSIS — N63 Unspecified lump in unspecified breast: Secondary | ICD-10-CM | POA: Diagnosis present

## 2022-03-15 DIAGNOSIS — R928 Other abnormal and inconclusive findings on diagnostic imaging of breast: Secondary | ICD-10-CM | POA: Insufficient documentation

## 2023-02-14 ENCOUNTER — Other Ambulatory Visit: Payer: Self-pay | Admitting: Neurology

## 2023-02-14 DIAGNOSIS — G3184 Mild cognitive impairment, so stated: Secondary | ICD-10-CM

## 2023-02-24 ENCOUNTER — Ambulatory Visit (HOSPITAL_COMMUNITY)
Admission: RE | Admit: 2023-02-24 | Discharge: 2023-02-24 | Disposition: A | Discharge status: Home / Self Care | Payer: Medicare Other | Source: Ambulatory Visit | Attending: Neurology | Admitting: Neurology

## 2023-02-24 DIAGNOSIS — G3184 Mild cognitive impairment, so stated: Secondary | ICD-10-CM | POA: Diagnosis present

## 2024-07-02 ENCOUNTER — Other Ambulatory Visit: Payer: Self-pay | Admitting: Internal Medicine

## 2024-07-02 DIAGNOSIS — Z1231 Encounter for screening mammogram for malignant neoplasm of breast: Secondary | ICD-10-CM
